# Patient Record
Sex: Male | Born: 1950 | Race: White | Hispanic: No | Marital: Married | Smoking: Never smoker
Health system: Southern US, Community
[De-identification: ages and names within clinical notes are randomized; demographics above are authoritative.]

## PROBLEM LIST (undated history)

## (undated) DIAGNOSIS — M109 Gout, unspecified: Secondary | ICD-10-CM

## (undated) DIAGNOSIS — N390 Urinary tract infection, site not specified: Secondary | ICD-10-CM

## (undated) DIAGNOSIS — I10 Essential (primary) hypertension: Secondary | ICD-10-CM

## (undated) HISTORY — PX: CLEFT PALATE REPAIR: SUR1165

---

## 2006-04-21 ENCOUNTER — Ambulatory Visit (HOSPITAL_COMMUNITY): Admission: RE | Admit: 2006-04-21 | Discharge: 2006-04-21 | Payer: Self-pay | Admitting: *Deleted

## 2013-11-06 ENCOUNTER — Other Ambulatory Visit (HOSPITAL_COMMUNITY): Payer: Self-pay | Admitting: Orthopedic Surgery

## 2013-11-27 ENCOUNTER — Encounter (HOSPITAL_COMMUNITY): Payer: Self-pay

## 2013-11-27 ENCOUNTER — Encounter (HOSPITAL_COMMUNITY)
Admission: RE | Admit: 2013-11-27 | Discharge: 2013-11-27 | Disposition: A | Discharge status: Home / Self Care | Payer: BC Managed Care – PPO | Source: Ambulatory Visit | Attending: Orthopedic Surgery | Admitting: Orthopedic Surgery

## 2013-11-27 DIAGNOSIS — M1710 Unilateral primary osteoarthritis, unspecified knee: Secondary | ICD-10-CM | POA: Insufficient documentation

## 2013-11-27 DIAGNOSIS — Z01810 Encounter for preprocedural cardiovascular examination: Secondary | ICD-10-CM | POA: Insufficient documentation

## 2013-11-27 DIAGNOSIS — Z01812 Encounter for preprocedural laboratory examination: Secondary | ICD-10-CM | POA: Diagnosis present

## 2013-11-27 HISTORY — DX: Essential (primary) hypertension: I10

## 2013-11-27 HISTORY — DX: Gout, unspecified: M10.9

## 2013-11-27 LAB — CBC
HCT: 40.8 % (ref 39.0–52.0)
Hemoglobin: 14 g/dL (ref 13.0–17.0)
MCH: 29.4 pg (ref 26.0–34.0)
MCHC: 34.3 g/dL (ref 30.0–36.0)
MCV: 85.5 fL (ref 78.0–100.0)
PLATELETS: 210 10*3/uL (ref 150–400)
RBC: 4.77 MIL/uL (ref 4.22–5.81)
RDW: 13.4 % (ref 11.5–15.5)
WBC: 7.4 10*3/uL (ref 4.0–10.5)

## 2013-11-27 LAB — BASIC METABOLIC PANEL
Anion gap: 15 (ref 5–15)
BUN: 13 mg/dL (ref 6–23)
CO2: 23 mEq/L (ref 19–32)
Calcium: 9.1 mg/dL (ref 8.4–10.5)
Chloride: 101 mEq/L (ref 96–112)
Creatinine, Ser: 0.89 mg/dL (ref 0.50–1.35)
GFR calc non Af Amer: 90 mL/min — ABNORMAL LOW (ref >90)
GLUCOSE: 93 mg/dL (ref 70–99)
POTASSIUM: 4.3 meq/L (ref 3.7–5.3)
SODIUM: 139 meq/L (ref 137–147)

## 2013-11-27 LAB — APTT: aPTT: 28 seconds (ref 24–37)

## 2013-11-27 LAB — PROTIME-INR
INR: 0.98 (ref 0.00–1.49)
PROTHROMBIN TIME: 13 s (ref 11.6–15.2)

## 2013-11-27 LAB — SURGICAL PCR SCREEN
MRSA, PCR: NEGATIVE
Staphylococcus aureus: NEGATIVE

## 2013-11-27 LAB — TYPE AND SCREEN
ABO/RH(D): AB POS
Antibody Screen: NEGATIVE

## 2013-11-27 LAB — ABO/RH: ABO/RH(D): AB POS

## 2013-11-27 NOTE — Progress Notes (Signed)
Dr Renne Crigler called for old ekgs, stress test

## 2013-11-27 NOTE — Pre-Procedure Instructions (Signed)
Justin Choi  11/27/2013   Your procedure is scheduled on:  12/04/13  Report to [facility] Admitting at 1145 AM.  Call this number if you have problems the morning of surgery: [phone]   Remember:   Do not eat food or drink liquids after midnight.   Take these medicines the morning of surgery with A SIP OF WATER:    Do not wear jewelry, make-up or nail polish.  Do not wear lotions, powders, or perfumes. You may wear deodorant.  Do not shave 48 hours prior to surgery. Men may shave face and neck.  Do not bring valuables to the hospital.  [facility] is not responsible                  for any belongings or valuables.               Contacts, dentures or bridgework may not be worn into surgery.  Leave suitcase in the car. After surgery it may be brought to your room.  For patients admitted to the hospital, discharge time is determined by your                treatment team.               Patients discharged the day of surgery will not be allowed to drive  home.  Name and phone number of your driver: family  Special Instructions: Shower using CHG 2 nights before surgery and the night before surgery.  If you shower the day of surgery use CHG.  Use special wash - you have one bottle of CHG for all showers.  You should use approximately 1/3 of the bottle for each shower.   Please read over the following fact sheets that you were given: Pain Booklet, Coughing and Deep Breathing, Blood Transfusion Information, MRSA Information and Surgical Site Infection Prevention

## 2013-12-03 MED ORDER — CEFAZOLIN SODIUM-DEXTROSE 2-3 GM-% IV SOLR
2.0000 g | INTRAVENOUS | Status: AC
  Administered 2013-12-04: 2 g via INTRAVENOUS
  Filled 2013-12-03: qty 50

## 2013-12-03 NOTE — H&P (Addendum)
TOTAL KNEE ADMISSION H&P  Patient is being admitted for left total knee arthroplasty.  Subjective:  Chief Complaint:left knee pain.  HPI: Justin Choi, 63 y.o. male, has a history of pain and functional disability in the left knee due to arthritis and has failed non-surgical conservative treatments for greater than 12 weeks to includeNSAID's and/or analgesics, corticosteriod injections, use of assistive devices, weight reduction as appropriate and activity modification.  Onset of symptoms was gradual, starting 10 years ago with gradually worsening course since that time. The patient noted no past surgery on the left knee(s).  Patient currently rates pain in the left knee(s) at 8 out of 10 with activity. Patient has night pain, worsening of pain with activity and weight bearing, pain that interferes with activities of daily living, pain with passive range of motion, crepitus and joint swelling.  Patient has evidence of subchondral cysts, subchondral sclerosis, periarticular osteophytes, joint subluxation and joint space narrowing by imaging studies. This patient has had significant worsening pain interfering with work. There is no active infection.  There are no active problems to display for this patient.  Past Medical History  Diagnosis Date  . Hypertension   . Gout     Past Surgical History  Procedure Laterality Date  . 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 Smoking status: Never Smoker   . Smokeless tobacco: Not on file  . Alcohol Use: Yes     Comment: occ    No family history on file.   Review of Systems  Constitutional: Negative.   HENT: Negative.   Eyes: Negative.   Respiratory: Negative.   Cardiovascular: Negative.   Gastrointestinal: Negative.   Genitourinary: Negative.   Musculoskeletal: Positive for joint pain.  Skin: Negative.   Neurological: Negative.    Endo/Heme/Allergies: Negative.   Psychiatric/Behavioral: Negative.     Objective:  Physical Exam  Constitutional: He appears well-developed.  HENT:  Head: Normocephalic.  Eyes: Pupils are equal, round, and reactive to light.  Neck: Normal range of motion.  Cardiovascular: Normal rate.   Respiratory: Effort normal.  Neurological: He is alert.  Skin: Skin is warm.  Psychiatric: He has a normal mood and affect.   left knee demonstrates intact skin range of motion is about 5-90 collaterals are stable extensor mechanism is intact pulses palpable ankle dorsi component flexion is intact  Vital signs in last 24 hours:    Labs:   There is no height or weight on file to calculate BMI.   Imaging Review Plain radiographs demonstrate severe degenerative joint disease of the left knee(s). The overall alignment issignificant varus. The bone quality appears to be good for age and reported activity level.  Assessment/Plan:  End stage arthritis, left knee   The patient history, physical examination, clinical judgment of the provider and imaging studies are consistent with end stage degenerative joint disease of the left knee(s) and total knee arthroplasty is deemed medically necessary. The treatment options including medical management, injection therapy arthroscopy and arthroplasty were discussed at length. The risks and benefits of total knee arthroplasty were presented and reviewed. The risks due to aseptic loosening, infection, stiffness, patella tracking problems, thromboembolic complications and other imponderables were discussed. The patient acknowledged the explanation, agreed to proceed with the plan and consent was signed. Patient is being admitted for inpatient treatment for surgery, pain control, PT, OT, prophylactic antibiotics,  VTE prophylaxis, progressive ambulation and ADL's and discharge planning. The patient is planning to be discharged home with home health services

## 2013-12-04 ENCOUNTER — Inpatient Hospital Stay (HOSPITAL_COMMUNITY): Payer: BC Managed Care – PPO | Admitting: Anesthesiology

## 2013-12-04 ENCOUNTER — Inpatient Hospital Stay (HOSPITAL_COMMUNITY)
Admission: RE | Admit: 2013-12-04 | Discharge: 2013-12-08 | DRG: 470 | Disposition: A | Discharge status: Skilled Nursing Facility | Payer: BC Managed Care – PPO | Source: Ambulatory Visit | Attending: Orthopedic Surgery | Admitting: Orthopedic Surgery

## 2013-12-04 ENCOUNTER — Encounter (HOSPITAL_COMMUNITY): Payer: Self-pay | Admitting: Anesthesiology

## 2013-12-04 ENCOUNTER — Encounter (HOSPITAL_COMMUNITY): Payer: BC Managed Care – PPO | Admitting: Anesthesiology

## 2013-12-04 ENCOUNTER — Encounter (HOSPITAL_COMMUNITY)
Admission: RE | Disposition: A | Discharge status: Skilled Nursing Facility | Payer: Self-pay | Source: Ambulatory Visit | Attending: Orthopedic Surgery

## 2013-12-04 DIAGNOSIS — M109 Gout, unspecified: Secondary | ICD-10-CM | POA: Diagnosis present

## 2013-12-04 DIAGNOSIS — I10 Essential (primary) hypertension: Secondary | ICD-10-CM | POA: Diagnosis present

## 2013-12-04 DIAGNOSIS — Z87790 Personal history of (corrected) congenital malformations of face and neck: Secondary | ICD-10-CM | POA: Diagnosis not present

## 2013-12-04 DIAGNOSIS — Z87720 Personal history of (corrected) congenital malformations of eye: Secondary | ICD-10-CM

## 2013-12-04 DIAGNOSIS — M1710 Unilateral primary osteoarthritis, unspecified knee: Secondary | ICD-10-CM

## 2013-12-04 DIAGNOSIS — G4730 Sleep apnea, unspecified: Secondary | ICD-10-CM | POA: Diagnosis present

## 2013-12-04 DIAGNOSIS — Z87721 Personal history of (corrected) congenital malformations of ear: Secondary | ICD-10-CM

## 2013-12-04 HISTORY — PX: TOTAL KNEE ARTHROPLASTY: SHX125

## 2013-12-04 LAB — URINALYSIS, ROUTINE W REFLEX MICROSCOPIC
BILIRUBIN URINE: NEGATIVE
GLUCOSE, UA: NEGATIVE mg/dL
Ketones, ur: NEGATIVE mg/dL
Leukocytes, UA: NEGATIVE
Nitrite: NEGATIVE
PH: 6 (ref 5.0–8.0)
Protein, ur: NEGATIVE mg/dL
Specific Gravity, Urine: 1.018 (ref 1.005–1.030)
Urobilinogen, UA: 1 mg/dL (ref 0.0–1.0)

## 2013-12-04 LAB — URINE MICROSCOPIC-ADD ON

## 2013-12-04 SURGERY — ARTHROPLASTY, KNEE, TOTAL
Anesthesia: General | Site: Knee | Laterality: Left

## 2013-12-04 MED ORDER — MORPHINE SULFATE 4 MG/ML IJ SOLN
INTRAMUSCULAR | Status: AC
  Filled 2013-12-04: qty 2

## 2013-12-04 MED ORDER — ROCURONIUM BROMIDE 100 MG/10ML IV SOLN
INTRAVENOUS | Status: DC | PRN
  Administered 2013-12-04: 50 mg via INTRAVENOUS

## 2013-12-04 MED ORDER — DEXAMETHASONE SODIUM PHOSPHATE 4 MG/ML IJ SOLN
INTRAMUSCULAR | Status: DC | PRN
  Administered 2013-12-04: 8 mg via INTRAVENOUS

## 2013-12-04 MED ORDER — ROCURONIUM BROMIDE 50 MG/5ML IV SOLN
INTRAVENOUS | Status: AC
  Filled 2013-12-04: qty 1

## 2013-12-04 MED ORDER — LIDOCAINE HCL (CARDIAC) 20 MG/ML IV SOLN
INTRAVENOUS | Status: AC
  Filled 2013-12-04: qty 5

## 2013-12-04 MED ORDER — NEOSTIGMINE METHYLSULFATE 10 MG/10ML IV SOLN
INTRAVENOUS | Status: DC | PRN
  Administered 2013-12-04: 3 mg via INTRAVENOUS

## 2013-12-04 MED ORDER — BISOPROLOL FUMARATE 5 MG PO TABS
5.0000 mg | ORAL_TABLET | Freq: Every day | ORAL | Status: DC
  Administered 2013-12-06 – 2013-12-08 (×3): 5 mg via ORAL
  Filled 2013-12-04 (×4): qty 1

## 2013-12-04 MED ORDER — METHOCARBAMOL 500 MG PO TABS
500.0000 mg | ORAL_TABLET | Freq: Four times a day (QID) | ORAL | Status: DC | PRN
  Administered 2013-12-05 – 2013-12-08 (×7): 500 mg via ORAL
  Filled 2013-12-04 (×8): qty 1

## 2013-12-04 MED ORDER — ONDANSETRON HCL 4 MG PO TABS
4.0000 mg | ORAL_TABLET | Freq: Four times a day (QID) | ORAL | Status: DC | PRN
  Administered 2013-12-05: 4 mg via ORAL
  Filled 2013-12-04: qty 1

## 2013-12-04 MED ORDER — OXYCODONE HCL 5 MG PO TABS
5.0000 mg | ORAL_TABLET | ORAL | Status: DC | PRN
  Administered 2013-12-05 (×2): 10 mg via ORAL
  Administered 2013-12-05 (×3): 5 mg via ORAL
  Administered 2013-12-06 – 2013-12-08 (×11): 10 mg via ORAL
  Filled 2013-12-04: qty 1
  Filled 2013-12-04 (×7): qty 2
  Filled 2013-12-04: qty 1
  Filled 2013-12-04 (×7): qty 2

## 2013-12-04 MED ORDER — SODIUM CHLORIDE 0.9 % IJ SOLN
INTRAMUSCULAR | Status: DC | PRN
  Administered 2013-12-04: 60 mL

## 2013-12-04 MED ORDER — MORPHINE SULFATE 4 MG/ML IJ SOLN
INTRAMUSCULAR | Status: DC | PRN
  Administered 2013-12-04: 8 mg

## 2013-12-04 MED ORDER — CLONIDINE HCL (ANALGESIA) 100 MCG/ML EP SOLN
150.0000 ug | Freq: Once | EPIDURAL | Status: DC
  Filled 2013-12-04: qty 1.5

## 2013-12-04 MED ORDER — ACETAMINOPHEN 325 MG PO TABS: 650.0000 mg | ORAL_TABLET | Freq: Four times a day (QID) | ORAL | Status: DC | PRN

## 2013-12-04 MED ORDER — LIDOCAINE HCL 4 % MT SOLN
OROMUCOSAL | Status: DC | PRN
  Administered 2013-12-04: 2 mL via TOPICAL

## 2013-12-04 MED ORDER — HYDROMORPHONE HCL 1 MG/ML IJ SOLN
INTRAMUSCULAR | Status: AC
  Filled 2013-12-04: qty 1

## 2013-12-04 MED ORDER — CEFAZOLIN SODIUM-DEXTROSE 2-3 GM-% IV SOLR
2.0000 g | Freq: Three times a day (TID) | INTRAVENOUS | Status: AC
  Administered 2013-12-05 (×2): 2 g via INTRAVENOUS
  Filled 2013-12-04 (×2): qty 50

## 2013-12-04 MED ORDER — POTASSIUM CHLORIDE IN NACL 20-0.9 MEQ/L-% IV SOLN
INTRAVENOUS | Status: AC
  Administered 2013-12-04 – 2013-12-05 (×2): via INTRAVENOUS
  Filled 2013-12-04 (×2): qty 1000

## 2013-12-04 MED ORDER — CHLORHEXIDINE GLUCONATE 4 % EX LIQD
60.0000 mL | Freq: Once | CUTANEOUS | Status: DC
  Filled 2013-12-04: qty 60

## 2013-12-04 MED ORDER — ADULT MULTIVITAMIN W/MINERALS CH
1.0000 | ORAL_TABLET | Freq: Every day | ORAL | Status: DC
  Administered 2013-12-04 – 2013-12-08 (×5): 1 via ORAL
  Filled 2013-12-04 (×5): qty 1

## 2013-12-04 MED ORDER — LACTATED RINGERS IV SOLN: INTRAVENOUS | Status: DC

## 2013-12-04 MED ORDER — LIDOCAINE HCL (CARDIAC) 20 MG/ML IV SOLN
INTRAVENOUS | Status: AC
  Filled 2013-12-04: qty 15

## 2013-12-04 MED ORDER — PROPOFOL 10 MG/ML IV BOLUS
INTRAVENOUS | Status: DC | PRN
  Administered 2013-12-04: 200 mg via INTRAVENOUS

## 2013-12-04 MED ORDER — ONDANSETRON HCL 4 MG/2ML IJ SOLN: 4.0000 mg | Freq: Four times a day (QID) | INTRAMUSCULAR | Status: DC | PRN

## 2013-12-04 MED ORDER — 0.9 % SODIUM CHLORIDE (POUR BTL) OPTIME
TOPICAL | Status: DC | PRN
  Administered 2013-12-04: 1000 mL

## 2013-12-04 MED ORDER — MIDAZOLAM HCL 2 MG/2ML IJ SOLN
INTRAMUSCULAR | Status: AC
  Filled 2013-12-04: qty 2

## 2013-12-04 MED ORDER — HYDROMORPHONE HCL 1 MG/ML IJ SOLN
0.2500 mg | INTRAMUSCULAR | Status: DC | PRN
  Administered 2013-12-04: 0.5 mg via INTRAVENOUS

## 2013-12-04 MED ORDER — ONDANSETRON HCL 4 MG/2ML IJ SOLN
INTRAMUSCULAR | Status: DC | PRN
  Administered 2013-12-04: 4 mg via INTRAVENOUS

## 2013-12-04 MED ORDER — DEXAMETHASONE SODIUM PHOSPHATE 4 MG/ML IJ SOLN
INTRAMUSCULAR | Status: AC
  Filled 2013-12-04: qty 4

## 2013-12-04 MED ORDER — MENTHOL 3 MG MT LOZG: 1.0000 | LOZENGE | OROMUCOSAL | Status: DC | PRN

## 2013-12-04 MED ORDER — LACTATED RINGERS IV SOLN
INTRAVENOUS | Status: DC | PRN
  Administered 2013-12-04 (×2): via INTRAVENOUS

## 2013-12-04 MED ORDER — PROPOFOL 10 MG/ML IV BOLUS
INTRAVENOUS | Status: AC
  Filled 2013-12-04: qty 20

## 2013-12-04 MED ORDER — BUPIVACAINE LIPOSOME 1.3 % IJ SUSP
INTRAMUSCULAR | Status: DC | PRN
  Administered 2013-12-04: 20 mL

## 2013-12-04 MED ORDER — DOCUSATE SODIUM 100 MG PO CAPS
100.0000 mg | ORAL_CAPSULE | Freq: Two times a day (BID) | ORAL | Status: DC
  Administered 2013-12-04 – 2013-12-08 (×8): 100 mg via ORAL
  Filled 2013-12-04 (×8): qty 1

## 2013-12-04 MED ORDER — METHOCARBAMOL 1000 MG/10ML IJ SOLN
500.0000 mg | Freq: Four times a day (QID) | INTRAVENOUS | Status: DC | PRN
  Filled 2013-12-04: qty 5

## 2013-12-04 MED ORDER — FENTANYL CITRATE 0.05 MG/ML IJ SOLN
INTRAMUSCULAR | Status: DC | PRN
  Administered 2013-12-04 (×5): 50 ug via INTRAVENOUS

## 2013-12-04 MED ORDER — ACETAMINOPHEN 650 MG RE SUPP
650.0000 mg | Freq: Four times a day (QID) | RECTAL | Status: DC | PRN
Start: 2013-12-04 — End: 2013-12-08

## 2013-12-04 MED ORDER — ONDANSETRON HCL 4 MG/2ML IJ SOLN: 4.0000 mg | Freq: Once | INTRAMUSCULAR | Status: DC | PRN

## 2013-12-04 MED ORDER — LIDOCAINE HCL (CARDIAC) 20 MG/ML IV SOLN
INTRAVENOUS | Status: DC | PRN
  Administered 2013-12-04: 30 mg via INTRAVENOUS

## 2013-12-04 MED ORDER — METOCLOPRAMIDE HCL 5 MG/ML IJ SOLN
5.0000 mg | Freq: Three times a day (TID) | INTRAMUSCULAR | Status: DC | PRN
Start: 2013-12-04 — End: 2013-12-08

## 2013-12-04 MED ORDER — PHENYLEPHRINE HCL 10 MG/ML IJ SOLN
INTRAMUSCULAR | Status: DC | PRN
  Administered 2013-12-04: 80 ug via INTRAVENOUS

## 2013-12-04 MED ORDER — ONDANSETRON HCL 4 MG/2ML IJ SOLN
INTRAMUSCULAR | Status: AC
  Filled 2013-12-04: qty 2

## 2013-12-04 MED ORDER — NEOSTIGMINE METHYLSULFATE 10 MG/10ML IV SOLN
INTRAVENOUS | Status: AC
  Filled 2013-12-04: qty 1

## 2013-12-04 MED ORDER — WARFARIN - PHARMACIST DOSING INPATIENT: Freq: Every day | Status: DC

## 2013-12-04 MED ORDER — BUPIVACAINE LIPOSOME 1.3 % IJ SUSP
20.0000 mL | Freq: Once | INTRAMUSCULAR | Status: DC
  Filled 2013-12-04: qty 20

## 2013-12-04 MED ORDER — MIDAZOLAM HCL 5 MG/5ML IJ SOLN
INTRAMUSCULAR | Status: DC | PRN
  Administered 2013-12-04 (×2): 1 mg via INTRAVENOUS

## 2013-12-04 MED ORDER — FENTANYL CITRATE 0.05 MG/ML IJ SOLN
INTRAMUSCULAR | Status: AC
  Filled 2013-12-04: qty 5

## 2013-12-04 MED ORDER — GLYCOPYRROLATE 0.2 MG/ML IJ SOLN
INTRAMUSCULAR | Status: DC | PRN
  Administered 2013-12-04 (×2): 0.4 mg via INTRAVENOUS

## 2013-12-04 MED ORDER — EPHEDRINE SULFATE 50 MG/ML IJ SOLN
INTRAMUSCULAR | Status: DC | PRN
  Administered 2013-12-04: 5 mg via INTRAVENOUS
  Administered 2013-12-04: 10 mg via INTRAVENOUS
  Administered 2013-12-04: 5 mg via INTRAVENOUS
  Administered 2013-12-04: 10 mg via INTRAVENOUS
  Administered 2013-12-04: 5 mg via INTRAVENOUS
  Administered 2013-12-04: 10 mg via INTRAVENOUS
  Administered 2013-12-04: 5 mg via INTRAVENOUS

## 2013-12-04 MED ORDER — WARFARIN SODIUM 7.5 MG PO TABS
7.5000 mg | ORAL_TABLET | Freq: Once | ORAL | Status: AC
  Administered 2013-12-05: 7.5 mg via ORAL
  Filled 2013-12-04: qty 1

## 2013-12-04 MED ORDER — CLONIDINE HCL (ANALGESIA) 100 MCG/ML EP SOLN
EPIDURAL | Status: DC | PRN
  Administered 2013-12-04: 1 mL

## 2013-12-04 MED ORDER — HYDROMORPHONE HCL 1 MG/ML IJ SOLN: 1.0000 mg | INTRAMUSCULAR | Status: DC | PRN

## 2013-12-04 MED ORDER — PHENOL 1.4 % MT LIQD
1.0000 | OROMUCOSAL | Status: DC | PRN
Start: 2013-12-04 — End: 2013-12-08

## 2013-12-04 MED ORDER — TRANEXAMIC ACID 100 MG/ML IV SOLN
1000.0000 mg | INTRAVENOUS | Status: AC
  Administered 2013-12-04: 1000 mg via INTRAVENOUS
  Filled 2013-12-04: qty 10

## 2013-12-04 MED ORDER — DEXAMETHASONE SODIUM PHOSPHATE 4 MG/ML IJ SOLN
INTRAMUSCULAR | Status: AC
Start: 2013-12-04 — End: 2013-12-04
  Filled 2013-12-04: qty 2

## 2013-12-04 MED ORDER — METOCLOPRAMIDE HCL 5 MG PO TABS: 5.0000 mg | ORAL_TABLET | Freq: Three times a day (TID) | ORAL | Status: DC | PRN

## 2013-12-04 MED ORDER — BUPIVACAINE HCL (PF) 0.25 % IJ SOLN
INTRAMUSCULAR | Status: DC | PRN
  Administered 2013-12-04: 20 mL

## 2013-12-04 SURGICAL SUPPLY — 61 items
BANDAGE ESMARK 6X9 LF (GAUZE/BANDAGES/DRESSINGS) ×1 IMPLANT
BLADE SAG 18X100X1.27 (BLADE) ×2 IMPLANT
BLADE SAW SGTL 13.0X1.19X90.0M (BLADE) ×2 IMPLANT
BNDG COHESIVE 6X5 TAN STRL LF (GAUZE/BANDAGES/DRESSINGS) ×2 IMPLANT
BNDG ELASTIC 6X10 VLCR STRL LF (GAUZE/BANDAGES/DRESSINGS) ×2 IMPLANT
BNDG ESMARK 6X9 LF (GAUZE/BANDAGES/DRESSINGS) ×2
BOWL SMART MIX CTS (DISPOSABLE) ×2 IMPLANT
CEMENT BONE SIMPLEX SPEEDSET (Cement) ×6 IMPLANT
COVER SURGICAL LIGHT HANDLE (MISCELLANEOUS) ×2 IMPLANT
DRAPE INCISE IOBAN 66X45 STRL (DRAPES) ×2 IMPLANT
DRAPE ORTHO SPLIT 77X108 STRL (DRAPES) ×3
DRAPE SURG ORHT 6 SPLT 77X108 (DRAPES) ×3 IMPLANT
DRAPE U-SHAPE 47X51 STRL (DRAPES) ×2 IMPLANT
DURAPREP 26ML APPLICATOR (WOUND CARE) ×2 IMPLANT
ELECT REM PT RETURN 9FT ADLT (ELECTROSURGICAL) ×2
ELECTRODE REM PT RTRN 9FT ADLT (ELECTROSURGICAL) ×1 IMPLANT
FACESHIELD WRAPAROUND (MASK) IMPLANT
GAUZE SPONGE 4X4 12PLY STRL (GAUZE/BANDAGES/DRESSINGS) ×2 IMPLANT
GAUZE XEROFORM 5X9 LF (GAUZE/BANDAGES/DRESSINGS) ×2 IMPLANT
GLOVE BIOGEL PI IND STRL 7.5 (GLOVE) ×2 IMPLANT
GLOVE BIOGEL PI IND STRL 8 (GLOVE) ×1 IMPLANT
GLOVE BIOGEL PI INDICATOR 7.5 (GLOVE) ×2
GLOVE BIOGEL PI INDICATOR 8 (GLOVE) ×1
GLOVE ECLIPSE 7.0 STRL STRAW (GLOVE) ×4 IMPLANT
GLOVE SURG ORTHO 8.0 STRL STRW (GLOVE) ×4 IMPLANT
GOWN STRL REUS W/ TWL LRG LVL3 (GOWN DISPOSABLE) ×1 IMPLANT
GOWN STRL REUS W/ TWL XL LVL3 (GOWN DISPOSABLE) ×2 IMPLANT
GOWN STRL REUS W/TWL LRG LVL3 (GOWN DISPOSABLE) ×1
GOWN STRL REUS W/TWL XL LVL3 (GOWN DISPOSABLE) ×2
HANDPIECE INTERPULSE COAX TIP (DISPOSABLE) ×1
HOOD PEEL AWAY FACE SHEILD DIS (HOOD) ×6 IMPLANT
IMMOBILIZER KNEE 24 THIGH 36 (MISCELLANEOUS) ×1 IMPLANT
IMMOBILIZER KNEE 24 UNIV (MISCELLANEOUS) ×2
KIT BASIN OR (CUSTOM PROCEDURE TRAY) ×2 IMPLANT
KIT ROOM TURNOVER OR (KITS) ×2 IMPLANT
KNEE/VIT E POLY LINER LEVEL 1B ×2 IMPLANT
MANIFOLD NEPTUNE II (INSTRUMENTS) ×2 IMPLANT
NEEDLE 21X1 OR PACK (NEEDLE) ×6 IMPLANT
NEEDLE SPNL 18GX3.5 QUINCKE PK (NEEDLE) ×2 IMPLANT
NS IRRIG 1000ML POUR BTL (IV SOLUTION) ×2 IMPLANT
PACK TOTAL JOINT (CUSTOM PROCEDURE TRAY) ×2 IMPLANT
PAD ABD 8X10 STRL (GAUZE/BANDAGES/DRESSINGS) ×2 IMPLANT
PAD ARMBOARD 7.5X6 YLW CONV (MISCELLANEOUS) ×4 IMPLANT
PAD CAST 4YDX4 CTTN HI CHSV (CAST SUPPLIES) ×1 IMPLANT
PADDING CAST COTTON 4X4 STRL (CAST SUPPLIES) ×1
PADDING CAST COTTON 6X4 STRL (CAST SUPPLIES) ×2 IMPLANT
RUBBERBAND STERILE (MISCELLANEOUS) IMPLANT
SET HNDPC FAN SPRY TIP SCT (DISPOSABLE) ×1 IMPLANT
STAPLER VISISTAT 35W (STAPLE) ×2 IMPLANT
SUCTION FRAZIER TIP 10 FR DISP (SUCTIONS) ×2 IMPLANT
SUT VIC AB 0 CTB1 27 (SUTURE) ×4 IMPLANT
SUT VIC AB 1 CT1 27 (SUTURE) ×5
SUT VIC AB 1 CT1 27XBRD ANBCTR (SUTURE) ×5 IMPLANT
SUT VIC AB 2-0 CT1 27 (SUTURE) ×2
SUT VIC AB 2-0 CT1 TAPERPNT 27 (SUTURE) ×2 IMPLANT
SYR 20CC LL (SYRINGE) ×6 IMPLANT
SYR CONTROL 10ML LL (SYRINGE) ×2 IMPLANT
TOWEL OR 17X24 6PK STRL BLUE (TOWEL DISPOSABLE) ×4 IMPLANT
TOWEL OR 17X26 10 PK STRL BLUE (TOWEL DISPOSABLE) ×4 IMPLANT
TRAY FOLEY CATH 16FRSI W/METER (SET/KITS/TRAYS/PACK) ×2 IMPLANT
WATER STERILE IRR 1000ML POUR (IV SOLUTION) ×4 IMPLANT

## 2013-12-04 NOTE — Progress Notes (Signed)
Patient arrived to 4N30 AAOx4 and denies any pain. CPM machine in place as well as SCD's. Pt oriented to the room, questions answered and call bell by his side. Will continue to monitor. Coretha Creswell, [place]

## 2013-12-04 NOTE — OR Nursing (Signed)
Patient with a yellow colored ring to left third finger.  Patient unable to remove it.  Dr. Katrinka Blazing also tried to remove it.  Dr. August Saucer aware prior to surgery start.

## 2013-12-04 NOTE — Brief Op Note (Signed)
12/04/2013  7:08 PM  PATIENT:  Justin Choi  63 y.o. male  PRE-OPERATIVE DIAGNOSIS:  LEFT KNEE OSTEOARTHRITIS  POST-OPERATIVE DIAGNOSIS:  LEFT KNEE OSTEOARTHRITIS  PROCEDURE:  Procedure(s): LEFT TOTAL KNEE ARTHROPLASTY  SURGEON:  Surgeon(s): Cammy Copa, MD  ASSISTANT: s vernon pa  ANESTHESIA:   general  EBL: 100 ml       BLOOD ADMINISTERED: none  DRAINS: none   LOCAL MEDICATIONS USED:  exparel marcaine mso4  SPECIMEN:  No Specimen  COUNTS:  YES  TOURNIQUET:   Total Tourniquet Time Documented: Thigh (Left) - -31 minutes Total: Thigh (Left) - -31 minutes   DICTATION: .Other Dictation: Dictation Number [id number]  PLAN OF CARE: Admit to inpatient   PATIENT DISPOSITION:  PACU - hemodynamically stable

## 2013-12-04 NOTE — Interval H&P Note (Signed)
History and Physical Interval Note:  12/04/2013 5:17 AM  Justin Choi  has presented today for surgery, with the diagnosis of LEFT KNEE OSTEOARTHRITIS  The various methods of treatment have been discussed with the patient and family. After consideration of risks, benefits and other options for treatment, the patient has consented to  Procedure(s): LEFT TOTAL KNEE ARTHROPLASTY (Left) as a surgical intervention .  The patient's history has been reviewed, patient examined, no change in status, stable for surgery.  I have reviewed the patient's chart and labs.  Questions were answered to the patient's satisfaction.     Hubert Raatz SCOTT

## 2013-12-04 NOTE — Progress Notes (Signed)
ANTICOAGULATION CONSULT NOTE - Initial Consult  Pharmacy Consult for Warfarin Indication: VTE prophylaxis  No Known Allergies  Patient Measurements: Height:  (177.8 cm) Weight: 258 lb (117.028 kg) IBW/kg (Calculated) : 73   Vital Signs: Temp: 97.9 F (36.6 C) (09/22 2143) Temp src: Oral (09/22 2143) BP: 141/88 mmHg (09/22 2143) Pulse Rate: 80 (09/22 2143)  Labs: No results found for this basename: HGB, HCT, PLT, APTT, LABPROT, INR, HEPARINUNFRC, CREATININE, CKTOTAL, CKMB, TROPONINI,  in the last 72 hours  Estimated Creatinine Clearance: 110.3 ml/min (by C-G formula based on Cr of 0.89).   Medical History: Past Medical History  Diagnosis Date  . Hypertension   . Gout      Assessment: 62yom admitted for L TKA.  Plan for warfarin as VTE px post op.  Baseline INR 0.98, CBC stable.    Goal of Therapy:  INR 2-3 Monitor platelets by anticoagulation protocol: Yes   Plan:  Warfarin 7.5mg  x1 Daily Protime  Leota Sauers Pharm.D. CPP, BCPS Clinical Pharmacist [phone] 12/04/2013 10:50 PM

## 2013-12-04 NOTE — Anesthesia Preprocedure Evaluation (Addendum)
Anesthesia Evaluation  Patient identified by MRN, date of birth, ID band Patient awake    Reviewed: Allergy & Precautions, H&P , NPO status , Patient's Chart, lab work & pertinent test results  History of Anesthesia Complications Negative for: history of anesthetic complications  Airway       Dental   Pulmonary sleep apnea ,          Cardiovascular hypertension, Pt. on home beta blockers     Neuro/Psych negative neurological ROS  negative psych ROS   GI/Hepatic negative GI ROS, Neg liver ROS,   Endo/Other  Morbid obesity  Renal/GU negative Renal ROS     Musculoskeletal negative musculoskeletal ROS (+)   Abdominal   Peds  Hematology negative hematology ROS (+)   Anesthesia Other Findings   Reproductive/Obstetrics                          Anesthesia Physical Anesthesia Plan  ASA: II  Anesthesia Plan: General   Post-op Pain Management:    Induction: Intravenous  Airway Management Planned: Oral ETT  Additional Equipment:   Intra-op Plan:   Post-operative Plan: Extubation in OR  Informed Consent: I have reviewed the patients History and Physical, chart, labs and discussed the procedure including the risks, benefits and alternatives for the proposed anesthesia with the patient or authorized representative who has indicated his/her understanding and acceptance.     Plan Discussed with: CRNA, Anesthesiologist and Surgeon  Anesthesia Plan Comments:         Anesthesia Quick Evaluation

## 2013-12-04 NOTE — Transfer of Care (Signed)
Immediate Anesthesia Transfer of Care Note  Patient: Justin Choi  Procedure(s) Performed: Procedure(s): LEFT TOTAL KNEE ARTHROPLASTY (Left)  Patient Location: PACU  Anesthesia Type:General  Level of Consciousness: awake, alert  and oriented  Airway & Oxygen Therapy: Patient Spontanous Breathing and Patient connected to face mask oxygen  Post-op Assessment: Report given to PACU RN, Post -op Vital signs reviewed and stable and Patient moving all extremities  Post vital signs: Reviewed and stable  Complications: No apparent anesthesia complications

## 2013-12-04 NOTE — Progress Notes (Signed)
Orthopedic Tech Progress Note Patient Details:  Justin Choi February 03, 1951 [id number]  CPM Left Knee CPM Left Knee: On Left Knee Flexion (Degrees): 40 Left Knee Extension (Degrees): 0 Additional Comments: trapeze bar patient helper  Footsie roll; viewed order from doctor's order list Nikki Dom 12/04/2013, 8:47 PM

## 2013-12-05 ENCOUNTER — Encounter (HOSPITAL_COMMUNITY): Payer: Self-pay | Admitting: Orthopedic Surgery

## 2013-12-05 LAB — BASIC METABOLIC PANEL
Anion gap: 15 (ref 5–15)
BUN: 16 mg/dL (ref 6–23)
CO2: 24 mEq/L (ref 19–32)
Calcium: 8.3 mg/dL — ABNORMAL LOW (ref 8.4–10.5)
Chloride: 99 mEq/L (ref 96–112)
Creatinine, Ser: 0.89 mg/dL (ref 0.50–1.35)
GFR, EST NON AFRICAN AMERICAN: 90 mL/min — AB (ref >90)
Glucose, Bld: 160 mg/dL — ABNORMAL HIGH (ref 70–99)
Potassium: 4.3 mEq/L (ref 3.7–5.3)
SODIUM: 138 meq/L (ref 137–147)

## 2013-12-05 LAB — CBC
HCT: 35.3 % — ABNORMAL LOW (ref 39.0–52.0)
HEMOGLOBIN: 12 g/dL — AB (ref 13.0–17.0)
MCH: 29.6 pg (ref 26.0–34.0)
MCHC: 34 g/dL (ref 30.0–36.0)
MCV: 87.2 fL (ref 78.0–100.0)
Platelets: 191 10*3/uL (ref 150–400)
RBC: 4.05 MIL/uL — ABNORMAL LOW (ref 4.22–5.81)
RDW: 13.4 % (ref 11.5–15.5)
WBC: 11 10*3/uL — ABNORMAL HIGH (ref 4.0–10.5)

## 2013-12-05 LAB — PROTIME-INR
INR: 1.07 (ref 0.00–1.49)
PROTHROMBIN TIME: 13.9 s (ref 11.6–15.2)

## 2013-12-05 MED ORDER — COUMADIN BOOK
Freq: Once | Status: AC
  Administered 2013-12-05: 17:00:00
  Filled 2013-12-05: qty 1

## 2013-12-05 MED ORDER — WARFARIN VIDEO
Freq: Once | Status: AC
  Administered 2013-12-05: 17:00:00

## 2013-12-05 MED ORDER — WARFARIN SODIUM 7.5 MG PO TABS
7.5000 mg | ORAL_TABLET | Freq: Once | ORAL | Status: AC
Start: 2013-12-05 — End: 2013-12-05
  Administered 2013-12-05: 7.5 mg via ORAL
  Filled 2013-12-05: qty 1

## 2013-12-05 NOTE — Anesthesia Postprocedure Evaluation (Signed)
  Anesthesia Post-op Note  Patient: Justin Choi  Procedure(s) Performed: Procedure(s): LEFT TOTAL KNEE ARTHROPLASTY (Left)  Patient Location: PACU  Anesthesia Type:General  Level of Consciousness: awake  Airway and Oxygen Therapy: Patient Spontanous Breathing  Post-op Pain: mild  Post-op Assessment: Post-op Vital signs reviewed, Patient's Cardiovascular Status Stable, Respiratory Function Stable, Patent Airway, No signs of Nausea or vomiting and Pain level controlled  Post-op Vital Signs: Reviewed and stable  Last Vitals:  Filed Vitals:   12/05/13 0500  BP: 112/61  Pulse: 77  Temp: 36.9 C  Resp: 18    Complications: No apparent anesthesia complications

## 2013-12-05 NOTE — Progress Notes (Signed)
Physical Therapy Treatment Patient Details Name: Justin Choi MRN: [id number] DOB: Oct 29, 1950 Today's Date: 12/05/2013    History of Present Illness s/p Lt TKA 12/04/13.    PT Comments    Pt slowly progressing with therapy but continues to require SNF for post acute rehab due to lack of caregiver support. Pt unsafe to D/C home without 24/7 (A) at this time. Will cont to follow per POC.  Follow Up Recommendations  SNF;Supervision/Assistance - 24 hour     Equipment Recommendations  3in1 (PT)    Recommendations for Other Services OT consult     Precautions / Restrictions Precautions Precautions: Fall;Knee Required Braces or Orthoses: Knee Immobilizer - Left Knee Immobilizer - Left: On when out of bed or walking Restrictions Weight Bearing Restrictions: Yes LLE Weight Bearing: Weight bearing as tolerated    Mobility  Bed Mobility Overal bed mobility: Needs Assistance Bed Mobility: Supine to Sit     Supine to sit: Min assist;HOB elevated     General bed mobility comments: incr difficulty this session exiting bed; relying heavily on overhead trapeze; max cues for sequencing and handheld (A) to elevat trunk  Transfers Overall transfer level: Needs assistance Equipment used: Rolling walker (2 wheeled) Transfers: Sit to/from Stand Sit to Stand: Min assist;From elevated surface         General transfer comment: bed elevated for pt; cues for hand placement and sequencing; pt continues to lean heavily to Rt and has difficulty achieving upright posture   Ambulation/Gait Ambulation/Gait assistance: Min assist Ambulation Distance (Feet): 20 Feet Assistive device: Rolling walker (2 wheeled) Gait Pattern/deviations: Step-to pattern;Decreased stance time - left;Decreased step length - right;Antalgic;Shuffle;Trunk flexed Gait velocity: very decreased Gait velocity interpretation: Below normal speed for age/gender General Gait Details: pt continues to have difficulty  clearing Rt foot and tends to ambulate with shuffled Rt step due to incr pain with weightbearing on Lt LE ; multimodal cues for upright posture ; pt fatigues quickly   Stairs            Wheelchair Mobility    Modified Rankin (Stroke Patients Only)       Balance Overall balance assessment: Needs assistance Sitting-balance support: Feet supported;No upper extremity supported Sitting balance-Leahy Scale: Fair     Standing balance support: During functional activity;Bilateral upper extremity supported Standing balance-Leahy Scale: Poor Standing balance comment: unsteady and relies on RW                     Cognition Arousal/Alertness: Awake/alert Behavior During Therapy: Flat affect Overall Cognitive Status: No family/caregiver present to determine baseline cognitive functioning Area of Impairment: Problem solving     Memory: Decreased short-term memory       Problem Solving: Slow processing;Difficulty sequencing;Requires verbal cues General Comments: pt did not remember earlier therapy session; continues to require incr time to process commands     Exercises Total Joint Exercises Ankle Circles/Pumps: AROM;Both;10 reps;Seated Quad Sets: AROM;Strengthening;Left;10 reps;Seated Heel Slides: AAROM;Left;5 reps;Other (comment) (limited by pain and ACE wrap) Hip ABduction/ADduction: AAROM;Left;10 reps;Seated    General Comments General comments (skin integrity, edema, etc.): pt with difficulty recalling no pillow under knee      Pertinent Vitals/Pain Pain Assessment: 0-10 Pain Score: 3  Pain Location: Lt knee Pain Descriptors / Indicators: Shooting Pain Intervention(s): Monitored during session;Premedicated before session;Repositioned    Home Living                      Prior Function  PT Goals (current goals can now be found in the care plan section) Acute Rehab PT Goals Patient Stated Goal: to walk better than i am   PT Goal  Formulation: With patient Time For Goal Achievement: 12/12/13 Potential to Achieve Goals: Good Progress towards PT goals: Progressing toward goals    Frequency  7X/week    PT Plan Current plan remains appropriate    Co-evaluation             End of Session Equipment Utilized During Treatment: Left knee immobilizer;Gait belt Activity Tolerance: Patient limited by fatigue;Patient limited by pain Patient left: in chair;with call bell/phone within reach     Time: 1522-1546 PT Time Calculation (min): 24 min  Charges:  $Gait Training: 8-22 mins $Therapeutic Exercise: 8-22 mins                    G CodesDonell Sievert, [place]  [phone] 12/05/2013, 4:36 PM

## 2013-12-05 NOTE — Progress Notes (Signed)
Subjective: Pt stable - moving in room   Objective: Vital signs in last 24 hours: Temp:  [97.4 F (36.3 C)-99.1 F (37.3 C)] 97.9 F (36.6 C) (09/23 1837) Pulse Rate:  [69-85] 76 (09/23 1837) Resp:  [12-20] 16 (09/23 1837) BP: (108-141)/(61-101) 141/72 mmHg (09/23 1837) SpO2:  [96 %-100 %] 99 % (09/23 1837) Weight:  [117.028 kg (258 lb)] 117.028 kg (258 lb) (09/22 2143)  Intake/Output from previous day: 09/22 0701 - 09/23 0700 In: 1500 [I.V.:1500] Out: 950 [Urine:900; Blood:50] Intake/Output this shift:    Exam:  Neurovascular intact Sensation intact distally Intact pulses distally  Labs:  Recent Labs  12/05/13 0445  HGB 12.0*    Recent Labs  12/05/13 0445  WBC 11.0*  RBC 4.05*  HCT 35.3*  PLT 191    Recent Labs  12/05/13 0445  NA 138  K 4.3  CL 99  CO2 24  BUN 16  CREATININE 0.89  GLUCOSE 160*  CALCIUM 8.3*    Recent Labs  12/05/13 0445  INR 1.07    Assessment/Plan: Plan cpm and pt thurs fri - may need snf Friday or sat   Justin Choi 12/05/2013, 8:39 PM

## 2013-12-05 NOTE — Progress Notes (Signed)
Orthopedic Tech Progress Note Patient Details:  AIJALON KIRTZ 1950/12/15 [id number]  Patient ID: Marquita Palms, male   DOB: Sep 18, 1950, 63 y.o.   MRN: [id number] Placed pt's lle in cpm@0 -35 degrees   Nikki Dom 12/05/2013, 9:08 PM

## 2013-12-05 NOTE — Clinical Social Work Psychosocial (Signed)
Clinical Social Work Department BRIEF PSYCHOSOCIAL ASSESSMENT 12/05/2013  Patient:  Justin Choi, Justin Choi     Account Number:  [id number]     Admit date:  12/04/2013  Clinical Social Worker:  Marciano Sequin  Date/Time:  12/05/2013 11:30 AM  Referred by:  RN  Date Referred:  12/05/2013 Referred for  SNF Placement   Other Referral:   Interview type:  Patient Other interview type:    PSYCHOSOCIAL DATA Living Status:  WIFE Admitted from facility:   Level of care:   Primary support name:  Kassius Battiste, [phone] Primary support relationship to patient:  SPOUSE Degree of support available:   Strong Support    CURRENT CONCERNS  Other Concerns:    SOCIAL WORK ASSESSMENT / PLAN CSW met pt at bedside. CSW introduce self and purpose of visit. Pt presented with a normal affect and calm mood. CSW and pt discussed the clinical team recommendations for rehab. CSW explained the SNF process to the pt. Pt consulted his wife regarding rehab. Pt explained to his wife the importance of rehab. CSW and pt reviewed the SNF list. CSW provided the pt with contact information for further questions. CSW will continue to follow this pt and assist with discharge as needed.   Assessment/plan status:  Information/Referral to [employer] Other assessment/ plan:   Information/referral to community resources:   SNF List    PATIENT'S/FAMILY'S RESPONSE TO PLAN OF CARE: agreed   Greta Doom, MSW, [place]

## 2013-12-05 NOTE — Progress Notes (Signed)
ANTICOAGULATION CONSULT NOTE - Follow up  Pharmacy Consult for Warfarin Indication: VTE prophylaxis  No Known Allergies  Patient Measurements: Height:  (177.8 cm) Weight: 258 lb (117.028 kg) IBW/kg (Calculated) : 73   Vital Signs: Temp: 97.9 F (36.6 C) (09/23 1009) Temp src: Oral (09/23 1009) BP: 108/69 mmHg (09/23 1009) Pulse Rate: 75 (09/23 1009)  Labs:  Recent Labs  12/05/13 0445  HGB 12.0*  HCT 35.3*  PLT 191  LABPROT 13.9  INR 1.07  CREATININE 0.89    Estimated Creatinine Clearance: 110.3 ml/min (by C-G formula based on Cr of 0.89).   Medical History: Past Medical History  Diagnosis Date  . Hypertension   . Gout      Assessment: 63yo male,  weight = 117kg admitted for L TKA.  Plan for warfarin as VTE px post op.  Baseline INR 0.98.  Today INR is 1.07 this AM. CBC decreased Hgb 12 < 14, Hct 35.3 < 40 on POD#1 L TKA. No bleeding noted.      Goal of Therapy:  INR 2-3 Monitor platelets by anticoagulation protocol: Yes   Plan:  Warfarin 7.5mg  x1 Daily INR    Noah Delaine, RPh Clinical Pharmacist Pager: [phone] 12/05/2013 3:45 PM

## 2013-12-05 NOTE — Progress Notes (Signed)
OT Cancellation Note  Patient Details Name: DRYSTAN READER MRN: [id number] DOB: 05-13-1950   Cancelled Treatment:      OT screened. Pt current D/C plan is SNF. No apparent immediate acute care OT needs, therefore will defer OT to SNF. If OT eval is needed please call Acute Rehab Dept. at [phone] or text page OT at [phone].    Earlie Raveling OTR/L [phone] 12/05/2013, 1:10 PM

## 2013-12-05 NOTE — Evaluation (Signed)
Physical Therapy Evaluation Patient Details Name: GRIER VU MRN: [id number] DOB: [birth year] Today's Date: 12/05/2013   History of Present Illness  s/p Lt TKA 12/04/13.  Clinical Impression  Pt is s/p Lt TKA POD#1 resulting in the deficits listed below (see PT Problem List).  Pt will benefit from skilled PT to increase their independence and safety with mobility to allow discharge to the venue listed below. Pt with difficulty following cues and commands at times during session. Pt will not have 24/7 (A) upon D/C home. Recommend SNF at this time for post acute rehab.      Follow Up Recommendations SNF;Supervision/Assistance - 24 hour    Equipment Recommendations  3in1 (PT)    Recommendations for Other Services OT consult     Precautions / Restrictions Precautions Precautions: Fall;Knee Required Braces or Orthoses: Knee Immobilizer - Left Knee Immobilizer - Left: On when out of bed or walking Restrictions Weight Bearing Restrictions: Yes LLE Weight Bearing: Weight bearing as tolerated      Mobility  Bed Mobility Overal bed mobility: Needs Assistance Bed Mobility: Supine to Sit     Supine to sit: Min guard;HOB elevated     General bed mobility comments: max cues for hand placement and sequencing; min guard to initiate movement of Lt LE  Transfers Overall transfer level: Needs assistance Equipment used: Rolling walker (2 wheeled) Transfers: Sit to/from Stand Sit to Stand: Min assist;From elevated surface         General transfer comment: cues for hand placement and safety with sit to stand; pt with heavy lean to Rt due to difficulty weightbearing on Lt  Ambulation/Gait Ambulation/Gait assistance: Min assist Ambulation Distance (Feet): 8 Feet Assistive device: Rolling walker (2 wheeled) Gait Pattern/deviations: Step-to pattern;Decreased stance time - left;Decreased step length - right;Shuffle;Wide base of support;Trunk flexed;Antalgic Gait velocity: very  decreased Gait velocity interpretation: Below normal speed for age/gender General Gait Details: pt with step to gt due to pain with weightbearing on lt Le; cues for sequencing and upright posture; (A) to manage RW   Stairs            Wheelchair Mobility    Modified Rankin (Stroke Patients Only)       Balance Overall balance assessment: Needs assistance Sitting-balance support: Feet supported;No upper extremity supported Sitting balance-Leahy Scale: Fair Sitting balance - Comments: sat EOB ~5 min fo dizziness to subside    Standing balance support: During functional activity;Bilateral upper extremity supported Standing balance-Leahy Scale: Poor Standing balance comment: requires RW for bil UE support                             Pertinent Vitals/Pain Pain Assessment: 0-10 Pain Score: 8  Pain Location: Lt anterior quad with ambulation Pain Descriptors / Indicators: Sharp Pain Intervention(s): Premedicated before session;Monitored during session;Repositioned    Home Living Family/patient expects to be discharged to:: Private residence Living Arrangements: Spouse/significant other Available Help at Discharge: Family;Available PRN/intermittently Type of Home: House Home Access: Stairs to enter Entrance Stairs-Rails: None Entrance Stairs-Number of Steps: 1 Home Layout: Bed/bath upstairs Home Equipment: Walker - 2 wheels;Bedside commode;Cane - single point Additional Comments: wife works during day; pt has tub and walk in shower upstairs ; downstairs is half bath     Prior Function Level of Independence: Independent               Hand Dominance   Dominant Hand: Right    Extremity/Trunk Assessment  Upper Extremity Assessment: Defer to OT evaluation           Lower Extremity Assessment: LLE deficits/detail   LLE Deficits / Details: Lt knee limited by pain; AROM in sitting; flexion limited by pain 40 degrees  Cervical / Trunk Assessment:  Normal  Communication   Communication: No difficulties  Cognition Arousal/Alertness: Awake/alert Behavior During Therapy: Flat affect Overall Cognitive Status: No family/caregiver present to determine baseline cognitive functioning Area of Impairment: Problem solving             Problem Solving: Slow processing;Difficulty sequencing;Requires verbal cues General Comments: pt appeared to take incr time processing commands/cues; uncertain of baseline     General Comments General comments (skin integrity, edema, etc.): reinforced no pillow under knee    Exercises Total Joint Exercises Ankle Circles/Pumps: AROM;Both;10 reps;Seated Quad Sets: AROM;Strengthening;Left;10 reps;Seated Long Arc Quad: AROM;Strengthening;Left;10 reps;Seated Goniometric ROM: see LE assessment      Assessment/Plan    PT Assessment Patient needs continued PT services  PT Diagnosis Difficulty walking;Generalized weakness;Acute pain   PT Problem List Decreased strength;Decreased range of motion;Decreased activity tolerance;Decreased balance;Decreased mobility;Decreased knowledge of use of DME;Decreased cognition;Decreased safety awareness;Decreased knowledge of precautions;Pain  PT Treatment Interventions DME instruction;Gait training;Functional mobility training;Therapeutic activities;Therapeutic exercise;Balance training;Neuromuscular re-education;Patient/family education   PT Goals (Current goals can be found in the Care Plan section) Acute Rehab PT Goals Patient Stated Goal: to get rehab then go home PT Goal Formulation: With patient Time For Goal Achievement: 12/12/13 Potential to Achieve Goals: Good    Frequency 7X/week   Barriers to discharge Decreased caregiver support wife will be at work during the day    Co-evaluation               End of Session Equipment Utilized During Treatment: Right knee immobilizer;Gait belt Activity Tolerance: Patient limited by fatigue Patient left: in  chair;with call bell/phone within reach Nurse Communication: Mobility status;Precautions;Weight bearing status         Time: 1610-9604 PT Time Calculation (min): 21 min   Charges:   PT Evaluation $Initial PT Evaluation Tier I: 1 Procedure PT Treatments $Gait Training: 8-22 mins   PT G CodesDonell Sievert, [place]  [phone] 12/05/2013, 9:24 AM

## 2013-12-05 NOTE — Progress Notes (Signed)
CARE MANAGEMENT NOTE 12/05/2013  Patient:  Justin Choi, Justin Choi   Account Number:  [id number]  Date Initiated:  12/05/2013  Documentation initiated by:  Jiles Crocker  Subjective/Objective Assessment:   ADMITTED FOR SURGERY     Action/Plan:   CM FOLLOWING FOR DCP   Anticipated DC Date:  12/08/2013   Anticipated DC Plan:  SKILLED NURSING FACILITY  In-house referral  Clinical Social Worker      DC Planning Services  CM consult         Status of service:  In process, will continue to follow Medicare Important Message given?   (If response is "NO", the following Medicare IM given date fields will be blank)  Per UR Regulation:  Reviewed for med. necessity/level of care/duration of stay  Comments:  9/23/2015Abelino Derrick RN,BSN,MHA [phone]

## 2013-12-05 NOTE — Clinical Social Work Placement (Addendum)
Clinical Social Work Department CLINICAL SOCIAL WORK PLACEMENT NOTE 12/05/2013  Patient:  Justin Choi, Justin Choi  Account Number:  [id number] Admit date:  12/04/2013  Clinical Social Worker:  Gwynne Edinger, LCSWA  Date/time:  12/05/2013 03:38 PM  Clinical Social Work is seeking post-discharge placement for this patient at the following level of care:   SKILLED NURSING   (*CSW will update this form in Epic as items are completed)   12/05/2013  Patient/family provided with [facility] Department of Clinical Social Work's list of facilities offering this level of care within the geographic area requested by the patient (or if unable, by the patient's family).  12/05/2013  Patient/family informed of their freedom to choose among providers that offer the needed level of care, that participate in Medicare, Medicaid or managed care program needed by the patient, have an available bed and are willing to accept the patient.  12/05/2013  Patient/family informed of [facility]' ownership interest in [facility], as well as of the fact that they are under no obligation to receive care at this facility.  PASARR submitted to EDS on 12/05/2013 PASARR number received on 12/05/2013  FL2 transmitted to all facilities in geographic area requested by pt/family on  12/05/2013 FL2 transmitted to all facilities within larger geographic area on 12/05/2013  Patient informed that his/her managed care company has contracts with or will negotiate with  certain facilities, including the following:     Patient/family informed of bed offers received:  12/06/2013 Patient chooses bed at [facility]  Physician recommends and patient chooses bed at    Patient to be transferred to [facility]  on  12/08/2013 Patient to be transferred to facility by [facility] Patient and family notified of transfer on 12/07/2013 Name of family member notified:  Pt and wife, Justin Choi  The following physician request  were entered in Epic:   Additional Comments: Dysheka Bibbs, MSW, LCSWA [phone]

## 2013-12-06 LAB — CBC
HCT: 33.1 % — ABNORMAL LOW (ref 39.0–52.0)
Hemoglobin: 11.4 g/dL — ABNORMAL LOW (ref 13.0–17.0)
MCH: 29.5 pg (ref 26.0–34.0)
MCHC: 34.4 g/dL (ref 30.0–36.0)
MCV: 85.5 fL (ref 78.0–100.0)
Platelets: 177 10*3/uL (ref 150–400)
RBC: 3.87 MIL/uL — ABNORMAL LOW (ref 4.22–5.81)
RDW: 13.7 % (ref 11.5–15.5)
WBC: 11.2 10*3/uL — ABNORMAL HIGH (ref 4.0–10.5)

## 2013-12-06 LAB — PROTIME-INR
INR: 1.38 (ref 0.00–1.49)
Prothrombin Time: 17 seconds — ABNORMAL HIGH (ref 11.6–15.2)

## 2013-12-06 MED ORDER — WARFARIN SODIUM 5 MG PO TABS: 5.0000 mg | ORAL_TABLET | Freq: Every day | ORAL | Status: DC

## 2013-12-06 MED ORDER — DSS 100 MG PO CAPS: 100.0000 mg | ORAL_CAPSULE | Freq: Two times a day (BID) | ORAL | Status: AC

## 2013-12-06 MED ORDER — METHOCARBAMOL 500 MG PO TABS: 500.0000 mg | ORAL_TABLET | Freq: Four times a day (QID) | ORAL | Status: DC | PRN

## 2013-12-06 MED ORDER — WARFARIN SODIUM 7.5 MG PO TABS
7.5000 mg | ORAL_TABLET | Freq: Once | ORAL | Status: AC
  Administered 2013-12-06: 7.5 mg via ORAL
  Filled 2013-12-06: qty 1

## 2013-12-06 MED ORDER — OXYCODONE HCL 5 MG PO TABS: 5.0000 mg | ORAL_TABLET | ORAL | Status: DC | PRN

## 2013-12-06 NOTE — Progress Notes (Signed)
ANTICOAGULATION CONSULT NOTE - Follow up  Pharmacy Consult for Warfarin Indication: VTE prophylaxis  No Known Allergies  Patient Measurements: Height:  (177.8 cm) Weight: 258 lb (117.028 kg) IBW/kg (Calculated) : 73   Vital Signs: Temp: 97.9 F (36.6 C) (09/24 1330) Temp src: Oral (09/24 1330) BP: 159/78 mmHg (09/24 1330) Pulse Rate: 76 (09/24 1330)  Labs:  Recent Labs  12/05/13 0445 12/06/13 0735  HGB 12.0* 11.4*  HCT 35.3* 33.1*  PLT 191 177  LABPROT 13.9 17.0*  INR 1.07 1.38  CREATININE 0.89  --     Estimated Creatinine Clearance: 110.3 ml/min (by C-G formula based on Cr of 0.89).   Medical History: Past Medical History  Diagnosis Date  . Hypertension   . Gout      Assessment: INR is 1.38 today in this 63yo male on coumadin for VTE prophylaxis s/p L TKA.Marland Kitchen His weight = 117kg.  Baseline INR 0.98.  Today INR is 1.07 this AM. CBC decreased /stable with Hgb 11.4, pltc 177K.   No bleeding noted.      Goal of Therapy:  INR 2-3 Monitor platelets by anticoagulation protocol: Yes   Plan:  Warfarin 7.5mg  x1 Daily INR    Noah Delaine, RPh Clinical Pharmacist Pager: [phone] 12/06/2013 3:21 PM

## 2013-12-06 NOTE — Discharge Summary (Signed)
Physician Discharge Summary  Patient ID: Justin Choi MRN: [id number] DOB/AGE: [birth year] 63 y.o.  Admit date: 12/04/2013 Discharge date: 12/07/2013 Admission Diagnoses:  Active Problems:   Arthritis of knee   Discharge Diagnoses:  Same  Surgeries: Procedure(s): LEFT TOTAL KNEE ARTHROPLASTY on 12/04/2013   Consultants:    Discharged Condition: Stable  Hospital Course: JAYDIAN SANTANA is an 63 y.o. male who was admitted 12/04/2013 with a chief complaint of knee pain, and found to have a diagnosis of knee arthritis.  They were brought to the operating room on 12/04/2013 and underwent the above named procedures. He was mobilized with PT on POD 1 and also started cpm at that time. Incision ok at DC   Antibiotics given:  Anti-infectives   Start     Dose/Rate Route Frequency Ordered Stop   12/05/13 0000  ceFAZolin (ANCEF) IVPB 2 g/50 mL premix     2 g 100 mL/hr over 30 Minutes Intravenous Every 8 hours 12/04/13 2155 12/05/13 0920   12/04/13 0600  ceFAZolin (ANCEF) IVPB 2 g/50 mL premix     2 g 100 mL/hr over 30 Minutes Intravenous On call to O.R. 12/03/13 1430 12/04/13 1615    .  Recent vital signs:  Filed Vitals:   12/06/13 1600  BP:   Pulse:   Temp:   Resp: 20    Recent laboratory studies:  Results for orders placed during the hospital encounter of 12/04/13  URINALYSIS, ROUTINE W REFLEX MICROSCOPIC      Result Value Ref Range   Color, Urine YELLOW  YELLOW   APPearance CLEAR  CLEAR   Specific Gravity, Urine 1.018  1.005 - 1.030   pH 6.0  5.0 - 8.0   Glucose, UA NEGATIVE  NEGATIVE mg/dL   Hgb urine dipstick TRACE (*) NEGATIVE   Bilirubin Urine NEGATIVE  NEGATIVE   Ketones, ur NEGATIVE  NEGATIVE mg/dL   Protein, ur NEGATIVE  NEGATIVE mg/dL   Urobilinogen, UA 1.0  0.0 - 1.0 mg/dL   Nitrite NEGATIVE  NEGATIVE   Leukocytes, UA NEGATIVE  NEGATIVE  URINE MICROSCOPIC-ADD ON      Result Value Ref Range   WBC, UA 0-2  <3 WBC/hpf   RBC / HPF 0-2  <3 RBC/hpf   Casts  HYALINE CASTS (*) NEGATIVE   Urine-Other MUCOUS PRESENT    PROTIME-INR      Result Value Ref Range   Prothrombin Time 13.9  11.6 - 15.2 seconds   INR 1.07  0.00 - 1.49  CBC      Result Value Ref Range   WBC 11.0 (*) 4.0 - 10.5 K/uL   RBC 4.05 (*) 4.22 - 5.81 MIL/uL   Hemoglobin 12.0 (*) 13.0 - 17.0 g/dL   HCT 19.1 (*) 47.8 - 29.5 %   MCV 87.2  78.0 - 100.0 fL   MCH 29.6  26.0 - 34.0 pg   MCHC 34.0  30.0 - 36.0 g/dL   RDW 62.1  30.8 - 65.7 %   Platelets 191  150 - 400 K/uL  BASIC METABOLIC PANEL      Result Value Ref Range   Sodium 138  137 - 147 mEq/L   Potassium 4.3  3.7 - 5.3 mEq/L   Chloride 99  96 - 112 mEq/L   CO2 24  19 - 32 mEq/L   Glucose, Bld 160 (*) 70 - 99 mg/dL   BUN 16  6 - 23 mg/dL   Creatinine, Ser 8.46  0.50 - 1.35 mg/dL  Calcium 8.3 (*) 8.4 - 10.5 mg/dL   GFR calc non Af Amer 90 (*) >90 mL/min   GFR calc Af Amer >90  >90 mL/min   Anion gap 15  5 - 15  PROTIME-INR      Result Value Ref Range   Prothrombin Time 17.0 (*) 11.6 - 15.2 seconds   INR 1.38  0.00 - 1.49  CBC      Result Value Ref Range   WBC 11.2 (*) 4.0 - 10.5 K/uL   RBC 3.87 (*) 4.22 - 5.81 MIL/uL   Hemoglobin 11.4 (*) 13.0 - 17.0 g/dL   HCT 16.1 (*) 09.6 - 04.5 %   MCV 85.5  78.0 - 100.0 fL   MCH 29.5  26.0 - 34.0 pg   MCHC 34.4  30.0 - 36.0 g/dL   RDW 40.9  81.1 - 91.4 %   Platelets 177  150 - 400 K/uL    Discharge Medications:     Medication List    STOP taking these medications       ALEVE 220 MG tablet  Generic drug:  naproxen sodium      TAKE these medications       bisoprolol 5 MG tablet  Commonly known as:  ZEBETA  Take 5 mg by mouth daily.     DSS 100 MG Caps  Take 100 mg by mouth 2 (two) times daily.     FISH OIL PO  Take 1 capsule by mouth daily.     methocarbamol 500 MG tablet  Commonly known as:  ROBAXIN  Take 1 tablet (500 mg total) by mouth every 6 (six) hours as needed for muscle spasms.     multivitamin with minerals Tabs tablet  Take 1 tablet by  mouth daily.     oxyCODONE 5 MG immediate release tablet  Commonly known as:  Oxy IR/ROXICODONE  Take 1-2 tablets (5-10 mg total) by mouth every 3 (three) hours as needed for breakthrough pain.     warfarin 5 MG tablet  Commonly known as:  COUMADIN  Take 1 tablet (5 mg total) by mouth daily.        Diagnostic Studies: Dg Chest 2 View  11/27/2013   CLINICAL DATA:  63 year old male presents for preoperative chest x-ray. Knee replacement surgery. No history of smoking.  EXAM: CHEST  2 VIEW  COMPARISON:  None.  FINDINGS: Cardiac silhouette projects within normal limits in size and contour.  Pronounced left lateral margin of the descending thoracic aorta with double density overlying the left heart.  No evidence of pulmonary vascular congestion.  No confluent airspace disease, pneumothorax, pleural effusion.  Pleural parenchymal thickening.  No displaced fracture.  Unremarkable appearance of the upper abdomen.  Flowing osteophyte production of the thoracic spine.  IMPRESSION: No radiographic evidence of acute cardiopulmonary disease.  Tortuosity of the thoracic aorta compatible with longstanding hypertension.  Signed,  Yvone Neu. Loreta Ave, DO  [facility]  [facility]   Electronically Signed   By: Gilmer Mor O.D.   On: 11/27/2013 16:34    Disposition: Final discharge disposition not confirmed      Discharge Instructions   Call MD / Call 911    Complete by:  As directed   If you experience chest pain or shortness of breath, CALL 911 and be transported to the hospital emergency room.  If you develope a fever above 101 F, pus (white drainage) or increased drainage or redness at the wound, or calf pain,  call your surgeon's office.     Constipation Prevention    Complete by:  As directed   Drink plenty of fluids.  Prune juice may be helpful.  You may use a stool softener, such as Colace (over the counter) 100 mg twice a day.  Use MiraLax (over the  counter) for constipation as needed.     Diet - low sodium heart healthy    Complete by:  As directed      Discharge instructions    Complete by:  As directed   Weight bearing as tolerated with crutches Knee range of motion as tolerated Keep incision dry     Increase activity slowly as tolerated    Complete by:  As directed      Weight bearing as tolerated    Complete by:  As directed               Signed: Lissa Rowles SCOTT 12/06/2013, 6:01 PM

## 2013-12-06 NOTE — Discharge Instructions (Signed)
Information on my medicine - Coumadin®   (Warfarin) ° °This medication education was reviewed with me or my healthcare representative as part of my discharge preparation.  The pharmacist that spoke with me during my hospital stay was:  Garrie Woodin Prescott, RPH ° °Why was Coumadin prescribed for you? °Coumadin was prescribed for you because you have a blood clot or a medical condition that can cause an increased risk of forming blood clots. Blood clots can cause serious health problems by blocking the flow of blood to the heart, lung, or brain. Coumadin can prevent harmful blood clots from forming. °As a reminder your indication for Coumadin is:   Select from menu ° °What test will check on my response to Coumadin? °While on Coumadin (warfarin) you will need to have an INR test regularly to ensure that your dose is keeping you in the desired range. The INR (international normalized ratio) number is calculated from the result of the laboratory test called prothrombin time (PT). ° °If an INR APPOINTMENT HAS NOT ALREADY BEEN MADE FOR YOU please schedule an appointment to have this lab work done by your health care provider within 7 days. °Your INR goal is usually a number between:  2 to 3 or your provider may give you a more narrow range like 2-2.5.  Ask your health care provider during an office visit what your goal INR is. ° °What  do you need to  know  About  COUMADIN? °Take Coumadin (warfarin) exactly as prescribed by your healthcare provider about the same time each day.  DO NOT stop taking without talking to the doctor who prescribed the medication.  Stopping without other blood clot prevention medication to take the place of Coumadin may increase your risk of developing a new clot or stroke.  Get refills before you run out. ° °What do you do if you miss a dose? °If you miss a dose, take it as soon as you remember on the same day then continue your regularly scheduled regimen the next day.  Do not take two doses  of Coumadin at the same time. ° °Important Safety Information °A possible side effect of Coumadin (Warfarin) is an increased risk of bleeding. You should call your healthcare provider right away if you experience any of the following: °  Bleeding from an injury or your nose that does not stop. °  Unusual colored urine (red or dark brown) or unusual colored stools (red or black). °  Unusual bruising for unknown reasons. °  A serious fall or if you hit your head (even if there is no bleeding). ° °Some foods or medicines interact with Coumadin® (warfarin) and might alter your response to warfarin. To help avoid this: °  Eat a balanced diet, maintaining a consistent amount of Vitamin K. °  Notify your provider about major diet changes you plan to make. °  Avoid alcohol or limit your intake to 1 drink for women and 2 drinks for men per day. °(1 drink is 5 oz. wine, 12 oz. beer, or 1.5 oz. liquor.) ° °Make sure that ANY health care provider who prescribes medication for you knows that you are taking Coumadin (warfarin).  Also make sure the healthcare provider who is monitoring your Coumadin knows when you have started a new medication including herbals and non-prescription products. ° °Coumadin® (Warfarin)  Major Drug Interactions  °Increased Warfarin Effect Decreased Warfarin Effect  °Alcohol (large quantities) °Antibiotics (esp. Septra/Bactrim, Flagyl, Cipro) °Amiodarone (Cordarone) °Aspirin (ASA) °Cimetidine (Tagamet) °  Megestrol (Megace) °NSAIDs (ibuprofen, naproxen, etc.) °Piroxicam (Feldene) °Propafenone (Rythmol SR) °Propranolol (Inderal) °Isoniazid (INH) °Posaconazole (Noxafil) Barbiturates (Phenobarbital) °Carbamazepine (Tegretol) °Chlordiazepoxide (Librium) °Cholestyramine (Questran) °Griseofulvin °Oral Contraceptives °Rifampin °Sucralfate (Carafate) °Vitamin K  ° °Coumadin® (Warfarin) Major Herbal Interactions  °Increased Warfarin Effect Decreased Warfarin Effect  °Garlic °Ginseng °Ginkgo biloba Coenzyme  Q10 °Green tea °St. John’s wort   ° °Coumadin® (Warfarin) FOOD Interactions  °Eat a consistent number of servings per week of foods HIGH in Vitamin K °(1 serving = ½ cup)  °Collards (cooked, or boiled & drained) °Kale (cooked, or boiled & drained) °Mustard greens (cooked, or boiled & drained) °Parsley *serving size only = ¼ cup °Spinach (cooked, or boiled & drained) °Swiss chard (cooked, or boiled & drained) °Turnip greens (cooked, or boiled & drained)  °Eat a consistent number of servings per week of foods MEDIUM-HIGH in Vitamin K °(1 serving = 1 cup)  °Asparagus (cooked, or boiled & drained) °Broccoli (cooked, boiled & drained, or raw & chopped) °Brussel sprouts (cooked, or boiled & drained) *serving size only = ½ cup °Lettuce, raw (green leaf, endive, romaine) °Spinach, raw °Turnip greens, raw & chopped  ° °These websites have more information on Coumadin (warfarin):  www.coumadin.com; °www.ahrq.gov/consumer/coumadin.htm; ° ° °

## 2013-12-06 NOTE — Progress Notes (Signed)
Subjective: Pt stable - moving ok   Objective: Vital signs in last 24 hours: Temp:  [97.9 F (36.6 C)-99.1 F (37.3 C)] 97.9 F (36.6 C) (09/24 1330) Pulse Rate:  [72-91] 76 (09/24 1330) Resp:  [16-20] 20 (09/24 1600) BP: (141-162)/(71-87) 159/78 mmHg (09/24 1330) SpO2:  [90 %-99 %] 98 % (09/24 1600)  Intake/Output from previous day: 09/23 0701 - 09/24 0700 In: 1965 [P.O.:840; I.V.:1125] Out: 1350 [Urine:1350] Intake/Output this shift: Total I/O In: 600 [P.O.:600] Out: 475 [Urine:475]  Exam:  Neurovascular intact Sensation intact distally Intact pulses distally  Labs:  Recent Labs  12/05/13 0445 12/06/13 0735  HGB 12.0* 11.4*    Recent Labs  12/05/13 0445 12/06/13 0735  WBC 11.0* 11.2*  RBC 4.05* 3.87*  HCT 35.3* 33.1*  PLT 191 177    Recent Labs  12/05/13 0445  NA 138  K 4.3  CL 99  CO2 24  BUN 16  CREATININE 0.89  GLUCOSE 160*  CALCIUM 8.3*    Recent Labs  12/05/13 0445 12/06/13 0735  INR 1.07 1.38    Assessment/Plan: Plan snf am   Justin Choi 12/06/2013, 5:58 PM

## 2013-12-06 NOTE — Progress Notes (Signed)
Physical Therapy Treatment Patient Details Name: Justin Choi MRN: [id number] DOB: 27-Aug-1950 Today's Date: 12/06/2013    History of Present Illness s/p Lt TKA 12/04/13.    PT Comments    Pt greatly limited with knee flexion ROM and exercises due to pain. Limited ambulation to chair only today secondary to c/o dizziness. Cont to recommend SNF for post acute rehab.   Follow Up Recommendations  SNF;Supervision/Assistance - 24 hour     Equipment Recommendations  3in1 (PT)    Recommendations for Other Services OT consult     Precautions / Restrictions Precautions Precautions: Fall;Knee Required Braces or Orthoses: Knee Immobilizer - Left Knee Immobilizer - Left: On when out of bed or walking Restrictions Weight Bearing Restrictions: Yes LLE Weight Bearing: Weight bearing as tolerated    Mobility  Bed Mobility Overal bed mobility: Needs Assistance Bed Mobility: Supine to Sit     Supine to sit: Min assist;HOB elevated     General bed mobility comments: pt reaching for UE support to elevate trunk; requires incr time and max cueing   Transfers Overall transfer level: Needs assistance Equipment used: Rolling walker (2 wheeled) Transfers: Sit to/from Stand Sit to Stand: Min assist;From elevated surface         General transfer comment: bed continues to be elevated to (A) pt elevate trunk; max multimodal cues for upright posture and hand placement  Ambulation/Gait Ambulation/Gait assistance: Mod assist Ambulation Distance (Feet): 10 Feet Assistive device: Rolling walker (2 wheeled) Gait Pattern/deviations: Step-to pattern;Decreased stance time - left;Decreased step length - right;Antalgic;Wide base of support;Trunk flexed Gait velocity: very decreased Gait velocity interpretation: Below normal speed for age/gender General Gait Details: pt limited with mobility today secondary to dizziness and fatigue; pt unsteady and required mod (A) with support through gt belt;  decr step length and ability to WB through Lt LE today   Stairs            Wheelchair Mobility    Modified Rankin (Stroke Patients Only)       Balance Overall balance assessment: Needs assistance Sitting-balance support: Feet supported;Single extremity supported Sitting balance-Leahy Scale: Poor Sitting balance - Comments: c/o dizziness initially; bracing himself due to pain Postural control: Right lateral lean Standing balance support: Bilateral upper extremity supported;During functional activity Standing balance-Leahy Scale: Poor Standing balance comment: heavy lean Rt                    Cognition Arousal/Alertness: Awake/alert Behavior During Therapy: Flat affect Overall Cognitive Status: No family/caregiver present to determine baseline cognitive functioning Area of Impairment: Problem solving     Memory: Decreased short-term memory       Problem Solving: Slow processing;Difficulty sequencing;Requires verbal cues General Comments: pt continues to have difficulties sequencing and processing motor commands    Exercises Total Joint Exercises Ankle Circles/Pumps: AROM;Both;10 reps;Supine Quad Sets: AROM;Strengthening;Left;10 reps;Seated Heel Slides: AAROM;Left;10 reps;Seated;Other (comment) (with use of sheet) Hip ABduction/ADduction: AAROM;Left;10 reps;Seated Long Arc Quad: 5 reps;AAROM;Strengthening;Left;Seated;Other (comment) (limited by pain) Goniometric ROM: knee flexion greatly limited by pain; AROM in sitting -4 to 30 degrees (with ACE wrap on)     General Comments        Pertinent Vitals/Pain Pain Assessment: 0-10 Pain Score: 5  Pain Location: Lt knee Pain Descriptors / Indicators: Constant;Burning Pain Intervention(s): Monitored during session;Repositioned;Premedicated before session;Ice applied    Home Living                      Prior Function  PT Goals (current goals can now be found in the care plan section)  Acute Rehab PT Goals Patient Stated Goal: to get up PT Goal Formulation: With patient Time For Goal Achievement: 12/12/13 Potential to Achieve Goals: Good Progress towards PT goals: Progressing toward goals    Frequency  7X/week    PT Plan Current plan remains appropriate    Co-evaluation             End of Session Equipment Utilized During Treatment: Left knee immobilizer;Gait belt Activity Tolerance: Patient limited by fatigue;Patient limited by pain Patient left: in chair;with call bell/phone within reach     Time: 0925-0949 PT Time Calculation (min): 24 min  Charges:  $Gait Training: 8-22 mins $Therapeutic Exercise: 8-22 mins                    G CodesDonell Sievert, War  [phone] 12/06/2013, 11:28 AM

## 2013-12-06 NOTE — Progress Notes (Signed)
Orthopedic Tech Progress Note Patient Details:  Justin Choi Jul 18, 1950 [id number]  Patient ID: Justin Choi, male   DOB: 1950-05-31, 63 y.o.   MRN: [id number] Placed pt's lle in cpm  @ 0-30 degrees  Nikki Dom 12/06/2013, 6:39 PM

## 2013-12-06 NOTE — Op Note (Signed)
NAME:  MARVEN, VELEY NO.:  [id number]  MEDICAL RECORD NO.:  [id number]  LOCATION:                                 FACILITY:  PHYSICIAN:  Burnard Bunting, M.D.    DATE OF BIRTH:  Aug 25, 1950  DATE OF PROCEDURE:  12/04/2013 DATE OF DISCHARGE:  12/04/2013                              OPERATIVE REPORT   PREOPERATIVE DIAGNOSIS:  Left knee arthritis.  POSTOPERATIVE DIAGNOSIS:  Left knee arthritis.  PROCEDURE:  Left total knee replacement.  SURGEON:  Burnard Bunting, M.D.  ASSISTANT:  Wende Neighbors, P.A.  ANESTHESIA:  General.  IMPLANTS UTILIZED:  [employer]ker Triathlon posterior cruciate retaining 6 femur, 6 tibia, 11 poly insert, 38-mm 3 pegged cemented patella.  INDICATIONS:  Valentine is a patient with end-stage knee arthritis, presents for total knee replacement after explanation of risks and benefits.  PROCEDURE IN DETAIL:  The patient was brought to operating room where general endotracheal anesthesia was induced.  Preoperative antibiotics was administered.  Time-out was called.  Left leg was prescrubbed with alcohol and Betadine, and allowed to air dry, prepped with DuraPrep solution and draped in sterile manner.  Collier Flowers was used to cover the operative field.  Leg was elevated and exsanguinated with Esmarch wrap. Tourniquet was inflated.  Anterior approach to knee was made.  Median parapatellar approach was made.  Precise location marked with #1 Vicryl suture.  Anterior soft tissue removed from the anterior distal femur. The patient had a significant synovitis within the suprapatellar pouch, which was removed.  The fat pad was partially excised, minimal soft tissue dissection performed medially.  At this time, ACL was released. Intramedullary alignment was used to cut 9 mm off the tibia. Collaterals and posterior neurovascular structures and PCL protected. Intramedullary alignment was then used to resect the distal femur, which was 10 mm based on preoperative  flexion contracture of over 10 degrees. This allowed 9 mm spacer to fit well at full extension.  The femur was then sized 3 degrees of external rotation, placed, size 6 was obtained. Posterior, anterior and chamfer cuts were performed.  Tibia was keel punched.  Patella was prepared.  Trial components placed.  The patient had full extension, full flexion, good stability and varus and valgus stress at 0, 30 and 90 degrees with an 11-mm spacer.  Implants were removed.  Thorough irrigation performed 3 liters of irrigating solution. Exparel was utilized to anesthetize the capsule.  At this time, components were cemented into position.  Excess cement was removed. Same stability parameters were maintained.  Patella tracked well after minimal lateral release.  Thorough irrigation was then performed after the tourniquet was released.  Minimal bleeding points encounter were controlled using electrocautery.  Incision then was closed over bolster using interrupted inverted #1 Vicryl suture, 0 Vicryl suture, 2-0 Vicryl suture and skin staples.  Solution of plain Marcaine was used to anesthetize the skin.  Marcaine, morphine, and clonidine injected into the joint for postop pain relief.  Velna Hatchet Vernon's assistance required during the case for opening and retraction of neurovascular structures, cementing and removing the cement.  Her assistance was a medical necessity.  The patient was placed in knee immobilizer,  transferred to the recovery room in stable condition.     Burnard Bunting, M.D.     GSD/MEDQ  D:  12/04/2013  T:  12/05/2013  Job:  [id number]

## 2013-12-07 LAB — PROTIME-INR
INR: 2.05 — ABNORMAL HIGH (ref 0.00–1.49)
Prothrombin Time: 23.1 seconds — ABNORMAL HIGH (ref 11.6–15.2)

## 2013-12-07 LAB — CBC
HCT: 32 % — ABNORMAL LOW (ref 39.0–52.0)
Hemoglobin: 11.2 g/dL — ABNORMAL LOW (ref 13.0–17.0)
MCH: 29.6 pg (ref 26.0–34.0)
MCHC: 35 g/dL (ref 30.0–36.0)
MCV: 84.4 fL (ref 78.0–100.0)
Platelets: 174 10*3/uL (ref 150–400)
RBC: 3.79 MIL/uL — ABNORMAL LOW (ref 4.22–5.81)
RDW: 13.7 % (ref 11.5–15.5)
WBC: 10.9 10*3/uL — AB (ref 4.0–10.5)

## 2013-12-07 MED ORDER — WARFARIN SODIUM 5 MG PO TABS
5.0000 mg | ORAL_TABLET | Freq: Once | ORAL | Status: AC
  Administered 2013-12-07: 5 mg via ORAL
  Filled 2013-12-07: qty 1

## 2013-12-07 NOTE — Progress Notes (Signed)
Physical Therapy Treatment Patient Details Name: Justin Choi MRN: [id number] DOB: September 01, 1950 Today's Date: 12/07/2013    History of Present Illness s/p Lt TKA 12/04/13.    PT Comments    Pt very limited with mobility today due to pain and dizziness. Encouraged pt OOB for all meals. Pt with difficulty recalling education provided in previous session. Educated nursing on technique for transfer back to bed safely with use of gt belt. Recommend SNF for post acute care.   Follow Up Recommendations  SNF;Supervision/Assistance - 24 hour     Equipment Recommendations  3in1 (PT)    Recommendations for Other Services OT consult     Precautions / Restrictions Precautions Precautions: Fall;Knee Required Braces or Orthoses: Knee Immobilizer - Left Knee Immobilizer - Left: On when out of bed or walking Restrictions Weight Bearing Restrictions: Yes LLE Weight Bearing: Weight bearing as tolerated    Mobility  Bed Mobility Overal bed mobility: Needs Assistance Bed Mobility: Supine to Sit     Supine to sit: Min assist;HOB elevated     General bed mobility comments: pt requires (A) to elevate trunk to sitting position; pt demo difficulty advancing Lt LE off EOB   Transfers Overall transfer level: Needs assistance Equipment used: Rolling walker (2 wheeled) Transfers: Sit to/from Stand Sit to Stand: Min assist;From elevated surface         General transfer comment: pt with difficulty pushing up to stand; max cues for hand placement and sequencing  Ambulation/Gait Ambulation/Gait assistance: Min assist Ambulation Distance (Feet): 14 Feet Assistive device: Rolling walker (2 wheeled) Gait Pattern/deviations: Step-to pattern;Decreased stance time - left;Decreased step length - right;Antalgic;Trunk flexed;Wide base of support Gait velocity: very decreased Gait velocity interpretation: Below normal speed for age/gender General Gait Details: pt limited due to dizziness; pt  requries max cues for gt sequencing and upright posture; pt requires (A) to maintain balance and manage RW   Stairs            Wheelchair Mobility    Modified Rankin (Stroke Patients Only)       Balance Overall balance assessment: Needs assistance Sitting-balance support: Feet supported;Single extremity supported Sitting balance-Leahy Scale: Poor Sitting balance - Comments: pt heavily leaning to Rt; c/o dizziness initially but subsided with ~5 min  Postural control: Right lateral lean Standing balance support: During functional activity;Bilateral upper extremity supported Standing balance-Leahy Scale: Poor Standing balance comment: relies on RW and min (A) ; tolerated standing to void x 2 min; heavy lean to Rt                    Cognition Arousal/Alertness: Awake/alert Behavior During Therapy: Flat affect Overall Cognitive Status: No family/caregiver present to determine baseline cognitive functioning Area of Impairment: Problem solving     Memory: Decreased short-term memory       Problem Solving: Slow processing;Difficulty sequencing;Requires verbal cues General Comments: no family present; pt continues to have difficulty recalling precautions (no pillow under knee) and recalling previous instructions and education from previous sessions    Exercises Total Joint Exercises Ankle Circles/Pumps: AROM;Both;10 reps;Seated Quad Sets: AROM;Strengthening;Left;10 reps;Seated Goniometric ROM: knee flexion AROM in sitting 25 degrees limited by pain     General Comments General comments (skin integrity, edema, etc.): BP 126/72; continued to reinforce no pillow under knee      Pertinent Vitals/Pain Pain Assessment: 0-10 Pain Score: 5  Pain Location: Lt knee Pain Descriptors / Indicators: Aching;Operative site guarding Pain Intervention(s): Limited activity within patient's tolerance;Monitored during session;Repositioned  Home Living                       Prior Function            PT Goals (current goals can now be found in the care plan section) Acute Rehab PT Goals Patient Stated Goal: to not lay in this bed so much PT Goal Formulation: With patient Time For Goal Achievement: 12/12/13 Potential to Achieve Goals: Good Progress towards PT goals: Progressing toward goals    Frequency  7X/week    PT Plan Current plan remains appropriate    Co-evaluation             End of Session Equipment Utilized During Treatment: Left knee immobilizer;Gait belt Activity Tolerance: Patient limited by fatigue;Patient limited by pain Patient left: in chair;with call bell/phone within reach     Time: 1610-9604 PT Time Calculation (min): 24 min  Charges:  $Gait Training: 8-22 mins $Therapeutic Exercise: 8-22 mins                    G CodesDonell Sievert,   [phone] 12/07/2013, 12:58 PM

## 2013-12-07 NOTE — Clinical Social Work Note (Signed)
CSW met pt at bedside. CSW introduce self and purpose of visit. CSW provided pt with his bed offer on 12/06/2013. Pt reported his wife has not decided on which SNF he should go to. Pt reported his wife is still working on making a decision from the list of bed offered presented to him yesterday. CSW ask the pt for permission to call his wife. The pt agreed. CSW attempted to contracted the wife on her cell, house and work phone. CSW left a voice mail on the pt's wife's cell phone. CSW is awaiting a call back.   [place], MSW, [place]

## 2013-12-07 NOTE — Progress Notes (Signed)
ANTICOAGULATION CONSULT NOTE - Follow up  Pharmacy Consult for Warfarin Indication: VTE prophylaxis  No Known Allergies  Patient Measurements: Height:  (177.8 cm) Weight: 258 lb (117.028 kg) IBW/kg (Calculated) : 73   Vital Signs: Temp: 99.1 F (37.3 C) (09/25 1058) Temp src: Oral (09/25 1058) BP: 120/76 mmHg (09/25 1058) Pulse Rate: 80 (09/25 1058)  Labs:  Recent Labs  12/05/13 0445 12/06/13 0735 12/07/13 0809  HGB 12.0* 11.4* 11.2*  HCT 35.3* 33.1* 32.0*  PLT 191 177 174  LABPROT 13.9 17.0* 23.1*  INR 1.07 1.38 2.05*  CREATININE 0.89  --   --     Estimated Creatinine Clearance: 110.3 ml/min (by C-G formula based on Cr of 0.89).   Assessment:  63yo male on coumadin for VTE prophylaxis s/p L TKA. INR is therapeutic at 2.05 after 3 doses of coumadin 7.5 mg given. Large 6.1 second jump in INR.  Coumadin education completed 9/24.  CBC stable, No bleeding noted.      Goal of Therapy:  INR 2-3 Monitor platelets by anticoagulation protocol: Yes   Plan:  Coumadin 5 mg po daily ordered on DC summary by MD.  Coumadin 5 mg po x 1 dose today Daily INR Herby Abraham, Pharm.D. [phone] 12/07/2013 2:37 PM

## 2013-12-08 LAB — PROTIME-INR
INR: 1.99 — ABNORMAL HIGH (ref 0.00–1.49)
Prothrombin Time: 22.6 seconds — ABNORMAL HIGH (ref 11.6–15.2)

## 2013-12-08 NOTE — Clinical Social Work Note (Signed)
Clinical Social Worker facilitated patient discharge including contacting patient family and facility to confirm patient discharge plans.  Clinical information faxed to facility and family agreeable with plan.  CSW arranged ambulance transport via [facility] to [facility].  RN to call report prior to discharge.  Clinical Social Worker will sign off for now as social work intervention is no longer needed. Please consult us again if new need arises.  Jesse Roniqua Kintz, LCSW [phone] 

## 2013-12-08 NOTE — Progress Notes (Signed)
Dressing c/d/i Exam stable AFVSS Dc to SNF today Rx in chart

## 2013-12-08 NOTE — Progress Notes (Signed)
Patient is discharged from room 4N31 at this time. Alert and in stable condition. IV site d/c'd per policy. Transported by Sharin Mons via stretcher to [facility] in [place] town. Report given to matt Hunt. All belonging are with patient on discharge.

## 2013-12-08 NOTE — Progress Notes (Signed)
Physical Therapy Treatment Patient Details Name: Justin Choi MRN: [id number] DOB: February 19, 1951 Today's Date: 01/03/14    History of Present Illness s/p Lt TKA 12/04/13.    PT Comments    Pt making steady progress with mobility.   Follow Up Recommendations  SNF;Supervision/Assistance - 24 hour     Equipment Recommendations  3in1 (PT)    Recommendations for Other Services OT consult     Precautions / Restrictions Precautions Precautions: Fall;Knee Required Braces or Orthoses: Knee Immobilizer - Left Knee Immobilizer - Left: On when out of bed or walking Restrictions LLE Weight Bearing: Weight bearing as tolerated    Mobility  Bed Mobility Overal bed mobility: Needs Assistance Bed Mobility: Supine to Sit     Supine to sit: Min assist     General bed mobility comments: bed flat and no rails. cues on sequence and technique. most assist needed with moving left leg to and off EOB.  Transfers Overall transfer level: Needs assistance Equipment used: Rolling walker (2 wheeled) Transfers: Sit to/from Stand Sit to Stand: Min assist;From elevated surface         General transfer comment: cues for hand placement and for ant weight shift with standing.  Ambulation/Gait Ambulation/Gait assistance: Min assist Ambulation Distance (Feet): 20 Feet Assistive device: Rolling walker (2 wheeled) Gait Pattern/deviations: Step-to pattern;Antalgic;Trunk flexed;Decreased step length - right;Decreased stance time - left;Shuffle         Stairs            Wheelchair Mobility    Modified Rankin (Stroke Patients Only)          Cognition Arousal/Alertness: Awake/alert Behavior During Therapy: WFL for tasks assessed/performed Overall Cognitive Status: Within Functional Limits for tasks assessed                 General Comments: no family present; pt continues to have difficulty recalling precautions (no pillow under knee) and recalling previous instructions  and education from previous sessions    Exercises Total Joint Exercises Ankle Circles/Pumps: AROM;Both;10 reps;Supine Quad Sets: AROM;Strengthening;Left;10 reps;Supine Heel Slides: AAROM;Strengthening;Left;10 reps;Supine Straight Leg Raises: AAROM;Strengthening;Left;10 reps;Supine Goniometric ROM: supine left knee flexion: 5-60 degrees AAROM    General Comments        Pertinent Vitals/Pain Pain Assessment: 0-10 Pain Score: 8  Pain Location: lt knee Pain Descriptors / Indicators: Aching;Sore Pain Intervention(s): Limited activity within patient's tolerance;Premedicated before session;Repositioned     PT Goals (current goals can now be found in the care plan section) Acute Rehab PT Goals Patient Stated Goal: to not lay in this bed so much PT Goal Formulation: With patient Time For Goal Achievement: 12/12/13 Potential to Achieve Goals: Good Progress towards PT goals: Progressing toward goals    Frequency  7X/week    PT Plan Current plan remains appropriate       End of Session Equipment Utilized During Treatment: Gait belt;Left knee immobilizer Activity Tolerance: Patient limited by pain;Patient limited by fatigue Patient left: in chair;with call bell/phone within reach     Time: 1045-1110 PT Time Calculation (min): 25 min  Charges:  $Gait Training: 8-22 mins $Therapeutic Exercise: 8-22 mins                    G Codes:      Sallyanne Kuster 01/03/2014, 1:59 PM  Sallyanne Kuster, PTA Office- [phone]

## 2014-06-03 ENCOUNTER — Other Ambulatory Visit: Payer: Self-pay | Admitting: Registered Nurse

## 2014-06-28 ENCOUNTER — Other Ambulatory Visit: Payer: Self-pay | Admitting: Dermatology

## 2015-08-01 DIAGNOSIS — N400 Enlarged prostate without lower urinary tract symptoms: Secondary | ICD-10-CM | POA: Diagnosis not present

## 2015-08-01 DIAGNOSIS — Z0000 Encounter for general adult medical examination without abnormal findings: Secondary | ICD-10-CM | POA: Diagnosis not present

## 2015-08-01 DIAGNOSIS — R972 Elevated prostate specific antigen [PSA]: Secondary | ICD-10-CM | POA: Diagnosis not present

## 2016-02-02 DIAGNOSIS — R351 Nocturia: Secondary | ICD-10-CM | POA: Diagnosis not present

## 2016-02-02 DIAGNOSIS — R972 Elevated prostate specific antigen [PSA]: Secondary | ICD-10-CM | POA: Diagnosis not present

## 2016-02-02 DIAGNOSIS — R3121 Asymptomatic microscopic hematuria: Secondary | ICD-10-CM | POA: Diagnosis not present

## 2016-02-02 DIAGNOSIS — R3915 Urgency of urination: Secondary | ICD-10-CM | POA: Diagnosis not present

## 2016-02-02 DIAGNOSIS — N401 Enlarged prostate with lower urinary tract symptoms: Secondary | ICD-10-CM | POA: Diagnosis not present

## 2016-02-16 DIAGNOSIS — R3121 Asymptomatic microscopic hematuria: Secondary | ICD-10-CM | POA: Diagnosis not present

## 2016-02-16 DIAGNOSIS — R3129 Other microscopic hematuria: Secondary | ICD-10-CM | POA: Diagnosis not present

## 2016-04-28 DIAGNOSIS — T07XXXA Unspecified multiple injuries, initial encounter: Secondary | ICD-10-CM | POA: Insufficient documentation

## 2016-05-18 DIAGNOSIS — Z7409 Other reduced mobility: Secondary | ICD-10-CM | POA: Insufficient documentation

## 2016-05-18 DIAGNOSIS — Z789 Other specified health status: Secondary | ICD-10-CM | POA: Insufficient documentation

## 2016-06-12 IMAGING — CR DG CHEST 2V
2 series · 2 of 2 positions shown · non-contrast
Comparison: None.

CLINICAL DATA: 62-year-old male presents for preoperative chest
x-ray. Knee replacement surgery. No history of smoking.

EXAM:
CHEST  2 VIEW

[w chest pa]
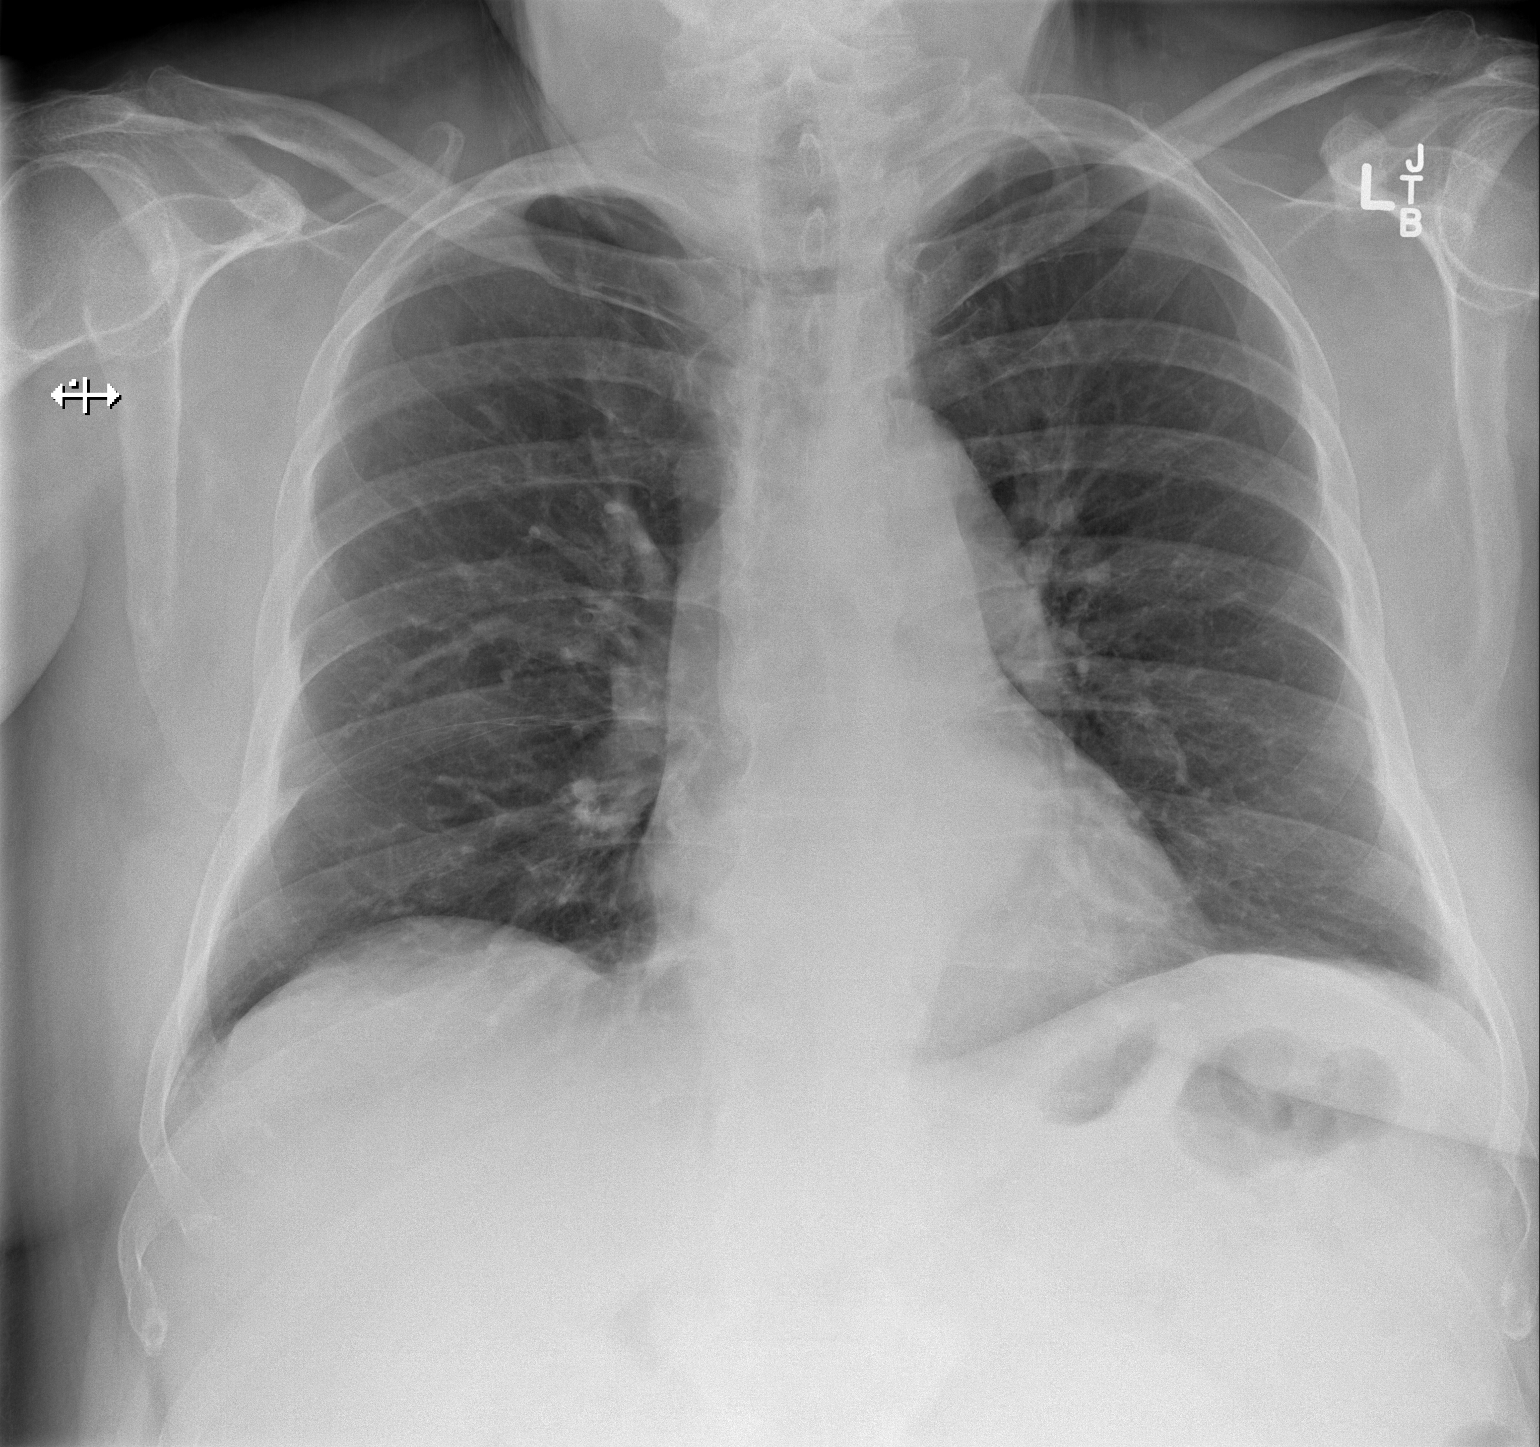

[w chest lat]
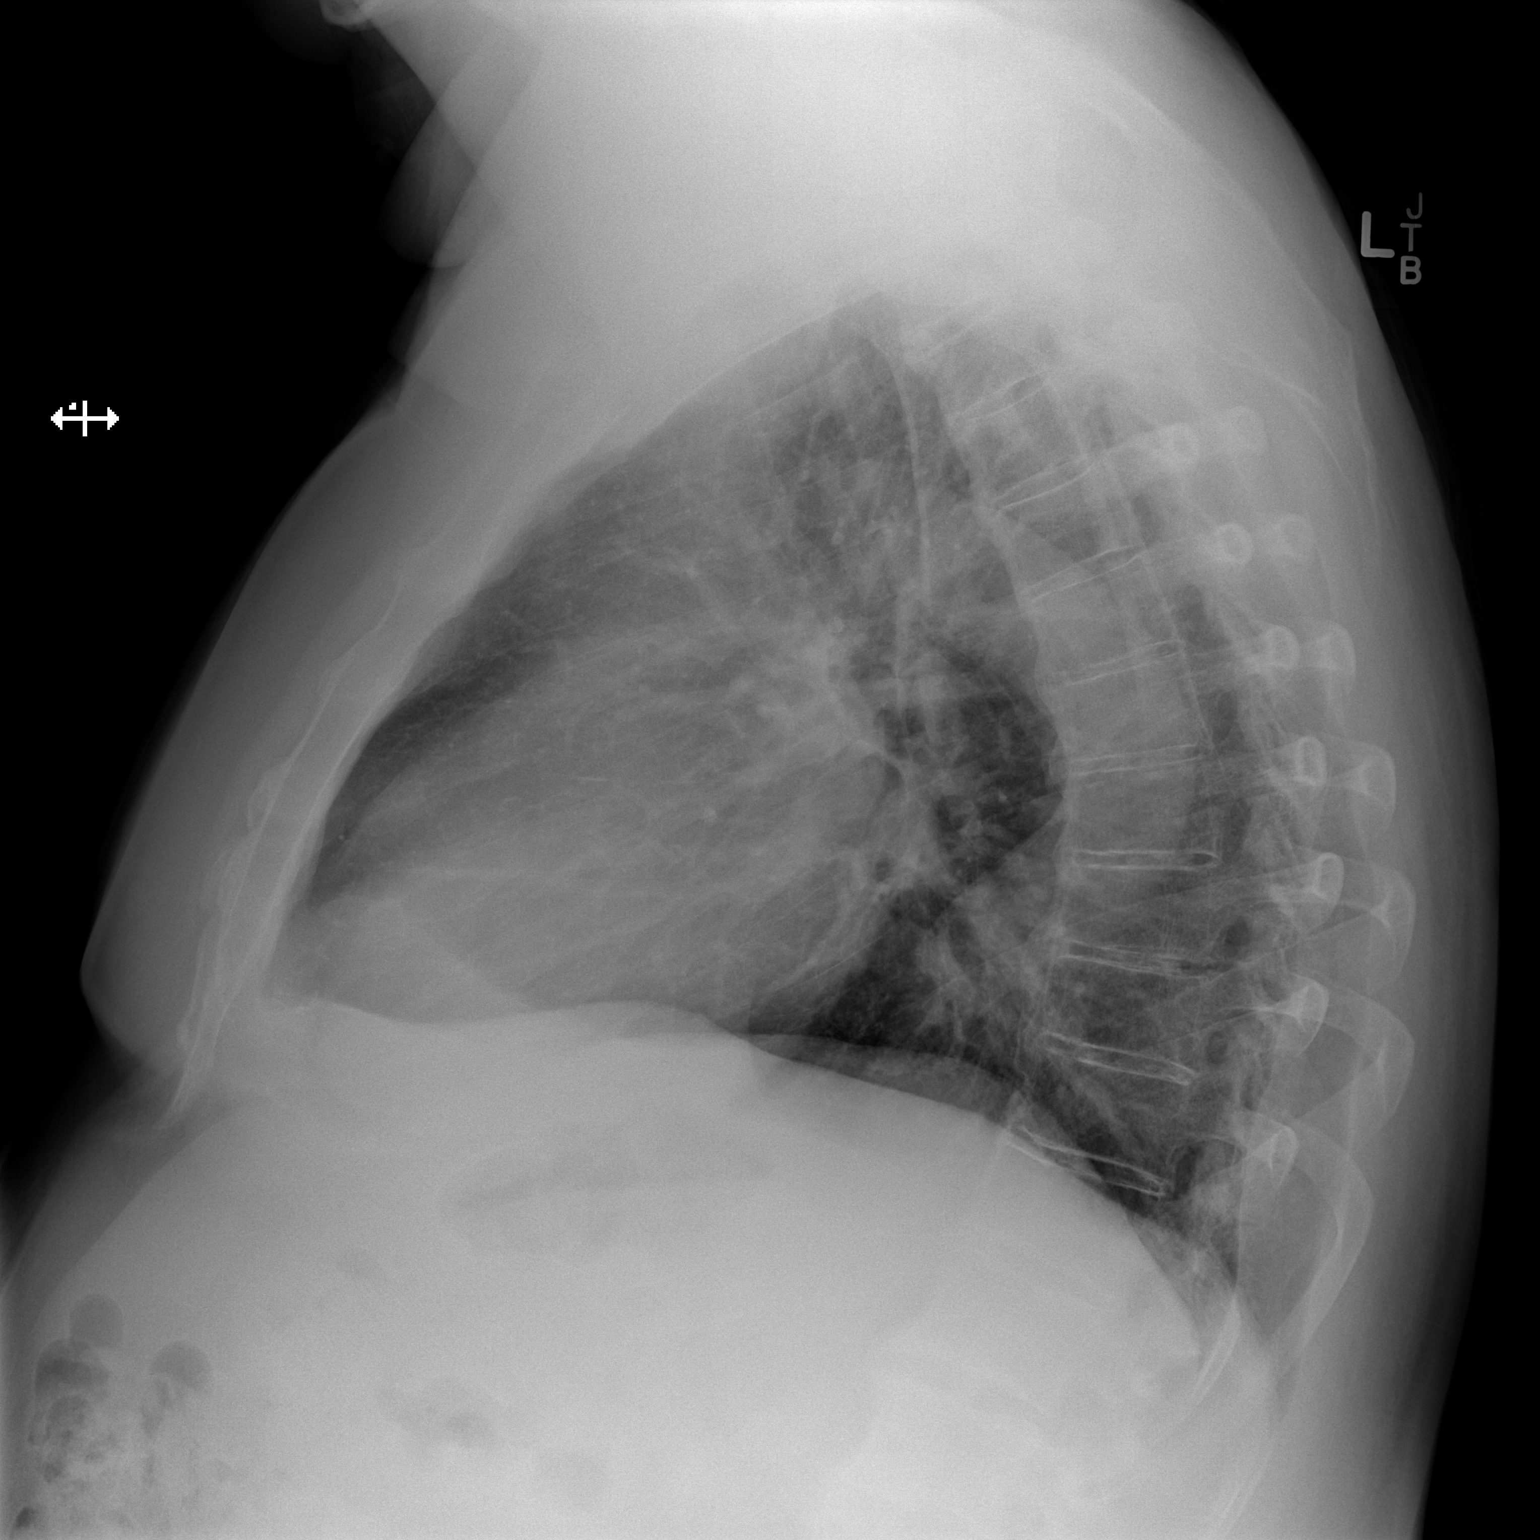

[2 of 2 positions shown; findings below may reference images not displayed]

FINDINGS: Cardiac silhouette projects within normal limits in size and
contour.

Pronounced left lateral margin of the descending thoracic aorta with
double density overlying the left heart.

No evidence of pulmonary vascular congestion.

No confluent airspace disease, pneumothorax, pleural effusion.

Pleural parenchymal thickening.

No displaced fracture.

Unremarkable appearance of the upper abdomen.

Flowing osteophyte production of the thoracic spine.
IMPRESSION: No radiographic evidence of acute cardiopulmonary disease.

Tortuosity of the thoracic aorta compatible with longstanding
hypertension.

## 2016-07-29 DIAGNOSIS — R269 Unspecified abnormalities of gait and mobility: Secondary | ICD-10-CM | POA: Insufficient documentation

## 2016-12-30 DIAGNOSIS — M4316 Spondylolisthesis, lumbar region: Secondary | ICD-10-CM | POA: Insufficient documentation

## 2017-06-20 DIAGNOSIS — R918 Other nonspecific abnormal finding of lung field: Secondary | ICD-10-CM | POA: Diagnosis not present

## 2017-06-20 DIAGNOSIS — R531 Weakness: Secondary | ICD-10-CM | POA: Diagnosis not present

## 2017-06-20 DIAGNOSIS — G459 Transient cerebral ischemic attack, unspecified: Secondary | ICD-10-CM | POA: Diagnosis not present

## 2017-06-20 DIAGNOSIS — I10 Essential (primary) hypertension: Secondary | ICD-10-CM | POA: Diagnosis not present

## 2017-06-20 DIAGNOSIS — R0602 Shortness of breath: Secondary | ICD-10-CM | POA: Diagnosis not present

## 2017-06-20 DIAGNOSIS — Z9114 Patient's other noncompliance with medication regimen: Secondary | ICD-10-CM | POA: Diagnosis not present

## 2017-06-20 DIAGNOSIS — R202 Paresthesia of skin: Secondary | ICD-10-CM | POA: Diagnosis not present

## 2017-06-20 DIAGNOSIS — R42 Dizziness and giddiness: Secondary | ICD-10-CM | POA: Diagnosis not present

## 2017-06-30 DIAGNOSIS — I10 Essential (primary) hypertension: Secondary | ICD-10-CM | POA: Diagnosis not present

## 2017-09-28 DIAGNOSIS — Z0000 Encounter for general adult medical examination without abnormal findings: Secondary | ICD-10-CM | POA: Diagnosis not present

## 2017-09-28 DIAGNOSIS — Z125 Encounter for screening for malignant neoplasm of prostate: Secondary | ICD-10-CM | POA: Diagnosis not present

## 2017-10-05 DIAGNOSIS — L570 Actinic keratosis: Secondary | ICD-10-CM | POA: Diagnosis not present

## 2017-10-05 DIAGNOSIS — Z23 Encounter for immunization: Secondary | ICD-10-CM | POA: Diagnosis not present

## 2017-10-05 DIAGNOSIS — I10 Essential (primary) hypertension: Secondary | ICD-10-CM | POA: Diagnosis not present

## 2017-10-05 DIAGNOSIS — H6120 Impacted cerumen, unspecified ear: Secondary | ICD-10-CM | POA: Diagnosis not present

## 2017-10-05 DIAGNOSIS — Z0000 Encounter for general adult medical examination without abnormal findings: Secondary | ICD-10-CM | POA: Diagnosis not present

## 2017-11-01 DIAGNOSIS — M8580 Other specified disorders of bone density and structure, unspecified site: Secondary | ICD-10-CM | POA: Diagnosis not present

## 2017-11-01 DIAGNOSIS — L918 Other hypertrophic disorders of the skin: Secondary | ICD-10-CM | POA: Diagnosis not present

## 2017-12-14 DIAGNOSIS — L03113 Cellulitis of right upper limb: Secondary | ICD-10-CM | POA: Diagnosis not present

## 2017-12-16 DIAGNOSIS — L03119 Cellulitis of unspecified part of limb: Secondary | ICD-10-CM | POA: Diagnosis not present

## 2017-12-16 DIAGNOSIS — M109 Gout, unspecified: Secondary | ICD-10-CM | POA: Diagnosis not present

## 2017-12-19 DIAGNOSIS — L03119 Cellulitis of unspecified part of limb: Secondary | ICD-10-CM | POA: Diagnosis not present

## 2017-12-19 DIAGNOSIS — M109 Gout, unspecified: Secondary | ICD-10-CM | POA: Diagnosis not present

## 2018-06-05 DIAGNOSIS — J019 Acute sinusitis, unspecified: Secondary | ICD-10-CM | POA: Diagnosis not present

## 2020-04-01 ENCOUNTER — Encounter: Payer: Self-pay | Admitting: Emergency Medicine

## 2020-04-01 ENCOUNTER — Emergency Department (INDEPENDENT_AMBULATORY_CARE_PROVIDER_SITE_OTHER)
Admission: EM | Admit: 2020-04-01 | Discharge: 2020-04-01 | Disposition: A | Discharge status: Home / Self Care | Payer: Medicare Other | Source: Home / Self Care

## 2020-04-01 ENCOUNTER — Other Ambulatory Visit: Payer: Self-pay

## 2020-04-01 DIAGNOSIS — N50812 Left testicular pain: Secondary | ICD-10-CM

## 2020-04-01 DIAGNOSIS — N390 Urinary tract infection, site not specified: Secondary | ICD-10-CM

## 2020-04-01 DIAGNOSIS — N401 Enlarged prostate with lower urinary tract symptoms: Secondary | ICD-10-CM

## 2020-04-01 LAB — POCT URINALYSIS DIP (MANUAL ENTRY)
Bilirubin, UA: NEGATIVE
Glucose, UA: NEGATIVE mg/dL
Nitrite, UA: NEGATIVE
Protein Ur, POC: 100 mg/dL — AB
Spec Grav, UA: >=1.03 — AB (ref 1.010–1.025)
Urobilinogen, UA: 0.2 E.U./dL
pH, UA: 5 (ref 5.0–8.0)

## 2020-04-01 MED ORDER — AMOXICILLIN-POT CLAVULANATE 875-125 MG PO TABS: 1.0000 | ORAL_TABLET | Freq: Two times a day (BID) | ORAL | 0 refills | Status: AC

## 2020-04-01 NOTE — Discharge Instructions (Addendum)
You are being treated for a urinary tract infection today. Follow-up with Dr. Annabell Howells or one of the providers over at [facility] urology as your enlarged prostate warrants further work-up and evaluation given that you are having symptoms which is resulting in a urinary tract infection.

## 2020-04-01 NOTE — ED Provider Notes (Signed)
[facility]    CSN: [id number] Arrival date & time: 04/01/20  1559      History   Chief Complaint Chief Complaint  Patient presents with  . Testicle Pain    left    HPI Justin Choi is a 70 y.o. male.   HPI  Patient in with over 1 week of left groin/testicle pain urinary frequency, dysuria, without any flank pain. Patient reports a known history of BPH. Seen urology over 3 years ago has not seen them since and was diagnosed with enlarged prostate and had an elevated PSA. Symptoms of BPH were non worrisome at that time, however as of recent he endorses hesitancy with urination, urine frequency, and occasional dysuria.  He denies a history of recurrent UTIs.  Symptoms of concern have been going on for a few weeks.  He has been afebrile.  At present he is only taking a prostate supplement for his BPH symptoms.   Past Medical History:  Diagnosis Date  . Gout   . Hypertension     Patient Active Problem List   Diagnosis Date Noted  . Spondylolisthesis of lumbar region 12/30/2016  . Gait disturbance 07/29/2016  . Impaired mobility and ADLs 05/18/2016  . Multiple trauma 04/28/2016  . Arthritis of knee 12/04/2013    Past Surgical History:  Procedure Laterality Date  . CLEFT PALATE REPAIR     as child  . 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 Authorizing Provider  bisoprolol (ZEBETA) 5 MG tablet Take 5 mg by mouth daily.    [provider]  docusate sodium 100 MG CAPS Take 100 mg by mouth 2 (two) times daily. 12/06/13   Cammy Copa, MD  methocarbamol (ROBAXIN) 500 MG tablet Take 1 tablet (500 mg total) by mouth every 6 (six) hours as needed for muscle spasms. 12/06/13   Cammy Copa, MD  Multiple  Vitamin (MULTIVITAMIN WITH MINERALS) TABS tablet Take 1 tablet by mouth daily.    [provider]  Omega-3 Fatty Acids (FISH OIL PO) Take 1 capsule by mouth daily.    [provider]  oxyCODONE (OXY IR/ROXICODONE) 5 MG immediate release tablet Take 1-2 tablets (5-10 mg total) by mouth every 3 (three) hours as needed for breakthrough pain. Patient not taking: Reported on 04/01/2020 12/06/13   Cammy Copa, MD  warfarin (COUMADIN) 5 MG tablet Take 1 tablet (5 mg total) by mouth daily. 12/06/13   Cammy Copa, MD    Family History No family history on file.  Social History Social History   Tobacco Use  . Smoking status: Never Smoker  Substance Use Topics  . Alcohol use: Yes    Comment: occ  . Drug use: No     Allergies   Gabapentin   Review of Systems Review of Systems Pertinent negatives listed in HPI Physical Exam Triage Vital Signs ED Triage Vitals  Enc Vitals Group     BP 04/01/20 1702 (!) 168/96     Pulse Rate 04/01/20 1702 60     Resp 04/01/20 1702 18     Temp 04/01/20 1702 98.6 F (37 C)     Temp Source 04/01/20 1702 Oral     SpO2 04/01/20 1702 98 %  Weight 04/01/20 1708 263 lb (119.3 kg)     Height 04/01/20 1708 5\' 10"  (1.778 m)     Head Circumference --      Peak Flow --      Pain Score 04/01/20 1707 3     Pain Loc --      Pain Edu? --      Excl. in GC? --    No data found.  Updated Vital Signs BP (!) 168/96 (BP Location: Right Arm)   Pulse 60   Temp 98.6 F (37 C) (Oral)   Resp 18   Ht 5\' 10"  (1.778 m)   Wt 263 lb (119.3 kg)   SpO2 98%   BMI 37.74 kg/m   Visual Acuity Right Eye Distance:   Left Eye Distance:   Bilateral Distance:    Right Eye Near:   Left Eye Near:    Bilateral Near:     Physical Exam General appearance: alert, obese, chronically ill-appearing,  cooperative and no distress Head: Normocephalic, without obvious abnormality, atraumatic Respiratory: Respirations even and unlabored, normal  respiratory rate Heart: Rate and rhythm normal. No gallop or murmurs noted on exam  Abdomen: BS +, no distention, no rebound tenderness, or no mass Extremities: No gross deformities Skin: Skin color, texture, turgor normal. No rashes seen  Psych: Appropriate mood and affect. Neurologic: Mental status: Alert, oriented to person, place, and time, thought content appropriate.   UC Treatments / Results  Labs (all labs ordered are listed, but only abnormal results are displayed) Labs Reviewed  POCT URINALYSIS DIP (MANUAL ENTRY) - Abnormal; Notable for the following components:      Result Value   Clarity, UA turbid (*)    Ketones, POC UA trace (5) (*)    Spec Grav, UA >=1.030 (*)    Blood, UA moderate (*)    Protein Ur, POC =100 (*)    Leukocytes, UA Large (3+) (*)    All other components within normal limits  URINE CULTURE    EKG   Radiology No results found.  Procedures Procedures (including critical care time)  Medications Ordered in UC Medications - No data to display  Initial Impression / Assessment and Plan / UC Course  I have reviewed the triage vital signs and the nursing notes.  Pertinent labs & imaging results that were available during my care of the patient were reviewed by me and considered in my medical decision making (see chart for details).    UA significant for urinary tract infection.  Urinary tract infection is complicated as it is an older male with a history of BPH.  Given history unable to determine length of time patient has had active urinary tract symptoms.  He is afebrile and negative CVA tenderness or back pain therefore low suspicion for possible pyelonephritis. Initially intended to put placed on Cipro for treatment of acute urinary tract infection however patient's medication list indicated currently taking Coumadin after further discussing with patient he is not taking the Coumadin this was from an old hospitalization.  Had already prescribed the  Augmentin we will go ahead and trial Augmentin to cover urinary tract infection while culture is pending.  If culture indicates any additional treatment warranted we will change at that time.  Patient agreed with plan and will follow-up with [facility] on tomorrow to schedule follow-up appointment for further evaluation of prostate enlargement symptoms.  Final Clinical Impressions(s) / UC Diagnoses   Final diagnoses:  Testicular pain, left  Complicated UTI (urinary  tract infection)  Benign prostatic hyperplasia with urinary frequency     Discharge Instructions     You are being treated for a urinary tract infection today. Follow-up with Dr. Annabell Howells or one of the providers over at [facility] urology as your enlarged prostate warrants further work-up and evaluation given that you are having symptoms which is resulting in a urinary tract infection.    ED Prescriptions    Medication Sig Dispense Auth. Provider   amoxicillin-clavulanate (AUGMENTIN) 875-125 MG tablet Take 1 tablet by mouth 2 (two) times daily for 10 days. 20 tablet Bing Neighbors, FNP     PDMP not reviewed this encounter.   Bing Neighbors, FNP 04/02/20 [id number]

## 2020-04-01 NOTE — ED Triage Notes (Addendum)
Left testicular pain x 1 week Pain has decreased  Denies urination problems  No known injury Pt does remember picking up something heavy last Monday  Concerned about a hernia COVID vaccine -[employer] in April 2021 - no booster

## 2020-04-03 LAB — URINE CULTURE
MICRO NUMBER:: 11430863
SPECIMEN QUALITY:: ADEQUATE

## 2020-08-15 ENCOUNTER — Emergency Department
Admission: EM | Admit: 2020-08-15 | Discharge: 2020-08-15 | Disposition: A | Discharge status: Home / Self Care | Payer: Medicare Other | Source: Home / Self Care

## 2020-08-15 ENCOUNTER — Other Ambulatory Visit: Payer: Self-pay

## 2020-08-15 DIAGNOSIS — N3090 Cystitis, unspecified without hematuria: Secondary | ICD-10-CM | POA: Diagnosis not present

## 2020-08-15 LAB — POCT URINALYSIS DIP (MANUAL ENTRY)
Bilirubin, UA: NEGATIVE
Glucose, UA: NEGATIVE mg/dL
Ketones, POC UA: NEGATIVE mg/dL
Nitrite, UA: POSITIVE — AB
Protein Ur, POC: 30 mg/dL — AB
Spec Grav, UA: >=1.03 — AB (ref 1.010–1.025)
Urobilinogen, UA: 0.2 E.U./dL
pH, UA: 5.5 (ref 5.0–8.0)

## 2020-08-15 MED ORDER — SULFAMETHOXAZOLE-TRIMETHOPRIM 800-160 MG PO TABS: 1.0000 | ORAL_TABLET | Freq: Two times a day (BID) | ORAL | 0 refills | Status: DC

## 2020-08-15 NOTE — ED Triage Notes (Signed)
Pt c/o urinary frequency and dysuria x 1 week. Hx of frequent UTIs according to pt. Denies back pain. Pt hypertensive today, does take BP meds in am.

## 2020-08-15 NOTE — ED Provider Notes (Signed)
[facility]    CSN: [id number] Arrival date & time: 08/15/20  1200      History   Chief Complaint Chief Complaint  Patient presents with  . Dysuria  . Urinary Frequency    HPI Justin Choi is a 70 y.o. male who presents with onset of dysuria and freaquency x 1 week. Has hx of frequent UTI's. Last one Jan 2022  Denies flank pain.     Past Medical History:  Diagnosis Date  . Gout   . Hypertension     Patient Active Problem List   Diagnosis Date Noted  . Spondylolisthesis of lumbar region 12/30/2016  . Gait disturbance 07/29/2016  . Impaired mobility and ADLs 05/18/2016  . Multiple trauma 04/28/2016  . Arthritis of knee 12/04/2013    Past Surgical History:  Procedure Laterality Date  . CLEFT PALATE REPAIR     as child  . 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 Authorizing Provider  sulfamethoxazole-trimethoprim (BACTRIM DS) 800-160 MG tablet Take 1 tablet by mouth 2 (two) times daily for 7 days. 08/15/20 08/22/20 Yes Rodriguez-Southworth, Nettie Elm, PA-C  bisoprolol (ZEBETA) 5 MG tablet Take 5 mg by mouth daily.    [provider]  docusate sodium 100 MG CAPS Take 100 mg by mouth 2 (two) times daily. 12/06/13   Cammy Copa, MD  methocarbamol (ROBAXIN) 500 MG tablet Take 1 tablet (500 mg total) by mouth every 6 (six) hours as needed for muscle spasms. 12/06/13   Cammy Copa, MD  Multiple Vitamin (MULTIVITAMIN WITH MINERALS) TABS tablet Take 1 tablet by mouth daily.    [provider]  Omega-3 Fatty Acids (FISH OIL PO) Take 1 capsule by mouth daily.    [provider]  oxyCODONE (OXY IR/ROXICODONE) 5 MG immediate release tablet Take 1-2 tablets (5-10 mg total) by mouth every 3  (three) hours as needed for breakthrough pain. Patient not taking: Reported on 04/01/2020 12/06/13   Cammy Copa, MD  warfarin (COUMADIN) 5 MG tablet Take 1 tablet (5 mg total) by mouth daily. 12/06/13 04/01/20  Cammy Copa, MD    Family History Family History  Family history unknown: Yes    Social History Social History   Tobacco Use  . Smoking status: Never Smoker  . Smokeless tobacco: Never Used  Substance Use Topics  . Alcohol use: Yes    Comment: occ  . Drug use: No     Allergies   Gabapentin   Review of Systems Review of Systems  Constitutional: Negative for chills, diaphoresis and fever.  Cardiovascular:       Hx of HTN and takes his meds qhs  Gastrointestinal: Negative for abdominal pain.  Genitourinary: Positive for dysuria, frequency and urgency. Negative for decreased urine volume, difficulty urinating and flank pain.  Musculoskeletal: Negative for myalgias.  Skin: Negative for rash.     Physical Exam Triage Vital Signs ED Triage Vitals  Enc Vitals Group     BP 08/15/20 1212 (!) 175/94     Pulse Rate 08/15/20 1212 83     Resp 08/15/20 1212 17     Temp 08/15/20 1212 99.5 F (37.5 C)     Temp Source 08/15/20 1212 Oral  SpO2 08/15/20 1212 96 %     Weight --      Height --      Head Circumference --      Peak Flow --      Pain Score 08/15/20 1213 0     Pain Loc --      Pain Edu? --      Excl. in GC? --    No data found.  Updated Vital Signs BP (!) 175/94 (BP Location: Right Arm)   Pulse 83   Temp 99.5 F (37.5 C) (Oral)   Resp 17   SpO2 96%   Visual Acuity Right Eye Distance:   Left Eye Distance:   Bilateral Distance:    Right Eye Near:   Left Eye Near:    Bilateral Near:     Physical Exam Physical Exam Vitals and nursing note reviewed.  Constitutional:      General: She is not in acute distress.    Appearance: She is not toxic-appearing.  HENT:     Head: Normocephalic.     Right Ear: External ear normal.      Left Ear: External ear normal.  Eyes:     General: No scleral icterus.    Conjunctiva/sclera: Conjunctivae normal.  Pulmonary:     Effort: Pulmonary effort is normal.  Abdominal:     General: Bowel sounds are normal.     Palpations: Abdomen is soft. There is no mass.     Tenderness: There is no guarding or rebound.     Comments: - CVA tenderness   Musculoskeletal:        General: Normal range of motion.     Cervical back: Neck supple.     = Skin:    General: Skin is warm and dry.     Findings: No rash.  Neurological:     Mental Status: She is alert and oriented to person, place, and time.     Gait: Gait normal.  Psychiatric:        Mood and Affect: Mood normal.        Behavior: Behavior normal.        Thought Content: Thought content normal.        Judgment: Judgment normal.     UC Treatments / Results  Labs (all labs ordered are listed, but only abnormal results are displayed) Labs Reviewed  POCT URINALYSIS DIP (MANUAL ENTRY) - Abnormal; Notable for the following components:      Result Value   Clarity, UA cloudy (*)    Spec Grav, UA >=1.030 (*)    Blood, UA moderate (*)    Protein Ur, POC =30 (*)    Nitrite, UA Positive (*)    Leukocytes, UA Large (3+) (*)    All other components within normal limits  URINE CULTURE    EKG   Radiology No results found.  Procedures Procedures (including critical care time)  Medications Ordered in UC Medications - No data to display  Initial Impression / Assessment and Plan / UC Course  I have reviewed the triage vital signs and the nursing notes. Pertinent labs  results that were available during my care of the patient were reviewed by me and considered in my medical decision making (see chart for details). I placed him on Bactrim DS as noted. Urine culture was sent out and we will call him if we need to change the medication.  Final Clinical Impressions(s) / UC Diagnoses   Final diagnoses:  Cystitis  Discharge  Instructions   None    ED Prescriptions    Medication Sig Dispense Auth. Provider   sulfamethoxazole-trimethoprim (BACTRIM DS) 800-160 MG tablet Take 1 tablet by mouth 2 (two) times daily for 7 days. 14 tablet Rodriguez-Southworth, Nettie Elm, PA-C     PDMP not reviewed this encounter.   Garey Ham, PA-C 08/15/20 1351

## 2020-08-17 LAB — URINE CULTURE
MICRO NUMBER:: 11968938
SPECIMEN QUALITY:: ADEQUATE

## 2020-08-18 ENCOUNTER — Emergency Department
Admission: EM | Admit: 2020-08-18 | Discharge: 2020-08-18 | Disposition: A | Discharge status: Home / Self Care | Payer: Medicare Other | Source: Home / Self Care

## 2020-08-18 ENCOUNTER — Other Ambulatory Visit: Payer: Self-pay

## 2020-08-18 DIAGNOSIS — N5089 Other specified disorders of the male genital organs: Secondary | ICD-10-CM

## 2020-08-18 DIAGNOSIS — N4889 Other specified disorders of penis: Secondary | ICD-10-CM

## 2020-08-18 MED ORDER — METHYLPREDNISOLONE ACETATE 80 MG/ML IJ SUSP
80.0000 mg | Freq: Once | INTRAMUSCULAR | Status: AC
  Administered 2020-08-18: 80 mg via INTRAMUSCULAR

## 2020-08-18 MED ORDER — PREDNISONE 20 MG PO TABS: ORAL_TABLET | ORAL | 0 refills | Status: DC

## 2020-08-18 MED ORDER — AMOXICILLIN-POT CLAVULANATE 875-125 MG PO TABS: 1.0000 | ORAL_TABLET | Freq: Two times a day (BID) | ORAL | 0 refills | Status: AC

## 2020-08-18 NOTE — Discharge Instructions (Addendum)
Advised/instructed patient to discontinue Bactrim now.  Start Augmentin now.  Advised patient to take medication as directed with food to completion.  Instructed patient if penile/scrotum swelling continues may start oral prednisone burst tomorrow morning, Tuesday, 08/19/2020.  Advised patient if swelling worsens over the next 24 to 48 hours please go to nearest ED for immediate evaluation.

## 2020-08-18 NOTE — ED Triage Notes (Signed)
Pt was seen on Friday for UTI and was prescribed sulfameth/trimethoprim. Pt st he has experienced genital swelling since starting the medication. Pt st the the urinary frequency has gotten slightly better but has had an increase in swelling.

## 2020-08-18 NOTE — ED Provider Notes (Addendum)
[facility]    CSN: [id number] Arrival date & time: 08/18/20  1100      History   Chief Complaint Chief Complaint  Patient presents with  . Groin Swelling    HPI Justin Choi is a 70 y.o. male.   HPI 70 year old male presents with genital swelling.  Patient reports was evaluated for UTI on Friday of last week and prescribed Bactrim.  Patient reports genital swelling since beginning Bactrim and is currently still taking this medication.  Patient reports taking 7 of 14 Bactrim, including 1 dose this morning.  Patient reported immediate genital swelling (shaft of penis, and scrotum bilaterally) after taking 1st Bactrim he continued and stopped at 7 total this morning.  Patient denies difficulty urinating, denies dysuria, denies hematuria.  Reports he is currently followed by Urology twice annually.  Past Medical History:  Diagnosis Date  . Gout   . Hypertension     Patient Active Problem List   Diagnosis Date Noted  . Spondylolisthesis of lumbar region 12/30/2016  . Gait disturbance 07/29/2016  . Impaired mobility and ADLs 05/18/2016  . Multiple trauma 04/28/2016  . Arthritis of knee 12/04/2013    Past Surgical History:  Procedure Laterality Date  . CLEFT PALATE REPAIR     as child  . 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 Authorizing Provider  amoxicillin-clavulanate (AUGMENTIN) 875-125 MG tablet Take 1 tablet by mouth every 12 (twelve) hours for 5 days. 08/18/20 08/23/20 Yes Trevor Iha, FNP  predniSONE (DELTASONE) 20 MG tablet Take 3 tabs PO daily x 5 days. 08/18/20  Yes Trevor Iha, FNP  bisoprolol (ZEBETA) 5 MG tablet Take 5 mg by mouth daily.    [provider]  docusate sodium 100 MG CAPS Take 100 mg  by mouth 2 (two) times daily. 12/06/13   Cammy Copa, MD  methocarbamol (ROBAXIN) 500 MG tablet Take 1 tablet (500 mg total) by mouth every 6 (six) hours as needed for muscle spasms. 12/06/13   Cammy Copa, MD  Multiple Vitamin (MULTIVITAMIN WITH MINERALS) TABS tablet Take 1 tablet by mouth daily.    [provider]  Omega-3 Fatty Acids (FISH OIL PO) Take 1 capsule by mouth daily.    [provider]  oxyCODONE (OXY IR/ROXICODONE) 5 MG immediate release tablet Take 1-2 tablets (5-10 mg total) by mouth every 3 (three) hours as needed for breakthrough pain. Patient not taking: No sig reported 12/06/13   Cammy Copa, MD  warfarin (COUMADIN) 5 MG tablet Take 1 tablet (5 mg total) by mouth daily. 12/06/13 04/01/20  Cammy Copa, MD    Family History Family History  Problem Relation Age of Onset  . Emphysema Mother   . Cancer Father     Social History Social History   Tobacco Use  . Smoking status: Never Smoker  . Smokeless tobacco: Never Used  Substance Use Topics  . Alcohol use: Yes    Alcohol/week: 1.0 standard drink    Types: 1 Shots of liquor per week    Comment: once a night   . Drug use: No     Allergies   Gabapentin   Review of Systems Review of Systems  Constitutional: Negative.   HENT: Negative.  Eyes: Negative.   Respiratory: Negative.   Gastrointestinal: Negative.   Genitourinary: Positive for penile swelling and scrotal swelling. Negative for penile pain and testicular pain.  Musculoskeletal: Negative.   Skin: Negative.   Neurological: Negative.      Physical Exam Triage Vital Signs ED Triage Vitals  Enc Vitals Group     BP 08/18/20 1138 (!) 159/92     Pulse Rate 08/18/20 1138 63     Resp 08/18/20 1138 16     Temp 08/18/20 1138 98.1 F (36.7 C)     Temp Source 08/18/20 1138 Oral     SpO2 08/18/20 1138 97 %     Weight 08/18/20 1134 260 lb (117.9 kg)     Height 08/18/20 1134 5\' 10"  (1.778 m)     Head  Circumference --      Peak Flow --      Pain Score 08/18/20 1133 2     Pain Loc --      Pain Edu? --      Excl. in GC? --    No data found.  Updated Vital Signs BP (!) 159/92 (BP Location: Right Arm)   Pulse 63   Temp 98.1 F (36.7 C) (Oral)   Resp 16   Ht 5\' 10"  (1.778 m)   Wt 260 lb (117.9 kg)   SpO2 97%   BMI 37.31 kg/m      Physical Exam Constitutional:      General: He is not in acute distress.    Appearance: Normal appearance. He is obese. He is not ill-appearing.  HENT:     Head: Normocephalic and atraumatic.     Mouth/Throat:     Mouth: Mucous membranes are moist.     Pharynx: Oropharynx is clear.  Eyes:     Extraocular Movements: Extraocular movements intact.     Conjunctiva/sclera: Conjunctivae normal.     Pupils: Pupils are equal, round, and reactive to light.  Cardiovascular:     Rate and Rhythm: Normal rate and regular rhythm.     Pulses: Normal pulses.     Heart sounds: Normal heart sounds.  Pulmonary:     Effort: Pulmonary effort is normal.     Breath sounds: Normal breath sounds.     Comments: No adventitious breath sounds noted Genitourinary:    Comments: Mild to moderate swelling of shaft of penis, foreskin, scrotum bilaterally, nontender to palpation, nonrestrictive nonconstrictive per patient Musculoskeletal:        General: Normal range of motion.     Cervical back: Normal range of motion and neck supple. No tenderness.  Lymphadenopathy:     Cervical: Cervical adenopathy present.  Skin:    General: Skin is warm and dry.  Neurological:     General: No focal deficit present.     Mental Status: He is alert and oriented to person, place, and time.  Psychiatric:        Mood and Affect: Mood normal.        Behavior: Behavior normal.      UC Treatments / Results  Labs (all labs ordered are listed, but only abnormal results are displayed) Labs Reviewed - No data to display  EKG   Radiology No results found.  Procedures Procedures  (including critical care time)  Medications Ordered in UC Medications  methylPREDNISolone acetate (DEPO-MEDROL) injection 80 mg (80 mg Intramuscular Given 08/18/20 1248)    Initial Impression / Assessment and Plan / UC Course  I have reviewed the triage vital  signs and the nursing notes.  Pertinent labs & imaging results that were available during my care of the patient were reviewed by me and considered in my medical decision making (see chart for details).     MDM: 1.  Penile swelling, 2.  Scrotal swelling.  Patient discharged home, hemodynamically stable. Final Clinical Impressions(s) / UC Diagnoses   Final diagnoses:  Penile swelling  Swelling of scrotum     Discharge Instructions     Advised/instructed patient to discontinue Bactrim now.  Start Augmentin now.  Advised patient to take medication as directed with food to completion.  Instructed patient if penile/scrotum swelling continues may start oral prednisone burst tomorrow morning, Tuesday, 08/19/2020.  Advised patient if swelling worsens over the next 24 to 48 hours please go to nearest ED for immediate evaluation.    ED Prescriptions    Medication Sig Dispense Auth. Provider   amoxicillin-clavulanate (AUGMENTIN) 875-125 MG tablet Take 1 tablet by mouth every 12 (twelve) hours for 5 days. 10 tablet Trevor Iha, FNP   predniSONE (DELTASONE) 20 MG tablet Take 3 tabs PO daily x 5 days. 15 tablet Trevor Iha, FNP     PDMP not reviewed this encounter.   Trevor Iha, FNP 08/18/20 1255    Trevor Iha, FNP 08/18/20 1256

## 2020-09-29 ENCOUNTER — Other Ambulatory Visit: Payer: Self-pay

## 2020-09-29 ENCOUNTER — Emergency Department (INDEPENDENT_AMBULATORY_CARE_PROVIDER_SITE_OTHER)
Admission: EM | Admit: 2020-09-29 | Discharge: 2020-09-29 | Disposition: A | Discharge status: Home / Self Care | Payer: Medicare Other | Source: Home / Self Care | Attending: Family Medicine | Admitting: Family Medicine

## 2020-09-29 DIAGNOSIS — N390 Urinary tract infection, site not specified: Secondary | ICD-10-CM

## 2020-09-29 DIAGNOSIS — N401 Enlarged prostate with lower urinary tract symptoms: Secondary | ICD-10-CM | POA: Diagnosis not present

## 2020-09-29 DIAGNOSIS — N138 Other obstructive and reflux uropathy: Secondary | ICD-10-CM

## 2020-09-29 DIAGNOSIS — R300 Dysuria: Secondary | ICD-10-CM

## 2020-09-29 LAB — POCT URINALYSIS DIP (MANUAL ENTRY)
Bilirubin, UA: NEGATIVE
Glucose, UA: NEGATIVE mg/dL
Ketones, POC UA: NEGATIVE mg/dL
Nitrite, UA: POSITIVE — AB
Protein Ur, POC: 100 mg/dL — AB
Spec Grav, UA: >=1.03 — AB (ref 1.010–1.025)
Urobilinogen, UA: 0.2 E.U./dL
pH, UA: 6 (ref 5.0–8.0)

## 2020-09-29 MED ORDER — AMOXICILLIN-POT CLAVULANATE 875-125 MG PO TABS: 1.0000 | ORAL_TABLET | Freq: Two times a day (BID) | ORAL | 0 refills | Status: DC

## 2020-09-29 NOTE — ED Triage Notes (Signed)
[facility] put him on augmentin and he would like that if possible.

## 2020-09-29 NOTE — ED Triage Notes (Signed)
Patient here with dysuria; had UTI last month along with testiculitis and saw urologist who place him on antibiotics and it cleared; now uncomfortable again. Does have enlarged prostate. Has had covid vaccinations.

## 2020-09-29 NOTE — ED Provider Notes (Signed)
[facility]    CSN: [id number] Arrival date & time: 09/29/20  1805      History   Chief Complaint Chief Complaint  Patient presents with   Dysuria    HPI Justin Choi is a 70 y.o. male.   HPI  Patient has known BPH.  He sees urology.  His only prostate medication at this time is a natural supplement called Prost-X.  He does not know what this includes.  He has recurring infections.  He gets up to urinate at night sometimes every 30 minutes to an hour.  Currently has dysuria, frequency.  He states he does not like water.  He mostly drinks Coke.  I told him that this was not a good choice from his health standpoint.  Told him his Coke is a diuretic and is not a good fluid replacement.  I told him Coke is empty calories and contributes to his obesity.  I told him that on his urine test today his specific gravity was 1030 which indicates dehydration.  This is not good for urinary tract infections and urinary tract conditions.  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 Authorizing Provider  amoxicillin-clavulanate (AUGMENTIN) 875-125 MG tablet Take 1 tablet by mouth every 12 (twelve) hours. 09/29/20  Yes Eustace Moore, MD  bisoprolol (ZEBETA) 5 MG tablet Take 5 mg by mouth daily.    [provider]  docusate sodium 100 MG CAPS  Take 100 mg by mouth 2 (two) times daily. 12/06/13   Cammy Copa, MD  Multiple Vitamin (MULTIVITAMIN WITH MINERALS) TABS tablet Take 1 tablet by mouth daily.    [provider]  Omega-3 Fatty Acids (FISH OIL PO) Take 1 capsule by mouth daily.    [provider]  warfarin (COUMADIN) 5 MG tablet Take 1 tablet (5 mg total) by mouth daily. 12/06/13 04/01/20  Cammy Copa, MD    Family History Family History  Problem Relation Age of Onset   Emphysema Mother    Cancer Father     Social History Social History   Tobacco Use   Smoking status: Never   Smokeless tobacco: Never  Substance Use Topics   Alcohol use: Yes    Alcohol/week: 1.0 standard drink    Types: 1 Shots of liquor per week    Comment: once a night    Drug use: No     Allergies   Gabapentin   Review of Systems Review of Systems See HPI  Physical Exam Triage Vital Signs ED Triage Vitals  Enc Vitals Group     BP 09/29/20 1903 (!) 153/83     Pulse Rate 09/29/20 1903 (!) 59     Resp 09/29/20 1903 16     Temp 09/29/20 1903 98.4 F (  36.9 C)     Temp Source 09/29/20 1903 Oral     SpO2 09/29/20 1903 98 %     Weight 09/29/20 1908 257 lb 15 oz (117 kg)     Height 09/29/20 1908 5\' 10"  (1.778 m)     Head Circumference --      Peak Flow --      Pain Score 09/29/20 1908 1     Pain Loc --      Pain Edu? --      Excl. in GC? --    No data found.  Updated Vital Signs BP (!) 153/83 (BP Location: Right Arm)   Pulse (!) 59   Temp 98.4 F (36.9 C) (Oral)   Resp 16   Ht 5\' 10"  (1.778 m)   Wt 117 kg   SpO2 98%   BMI 37.01 kg/m      Physical Exam Constitutional:      General: He is not in acute distress.    Appearance: He is well-developed. He is obese.     Comments: Ambulates with a cane  HENT:     Head: Normocephalic and atraumatic.     Mouth/Throat:     Comments: Is wearing mask Eyes:     Conjunctiva/sclera: Conjunctivae normal.     Pupils: Pupils are equal, round, and  reactive to light.  Cardiovascular:     Rate and Rhythm: Normal rate.  Pulmonary:     Effort: Pulmonary effort is normal. No respiratory distress.  Abdominal:     General: There is distension.     Palpations: Abdomen is soft.     Tenderness: There is no right CVA tenderness or left CVA tenderness.     Comments: Protuberant  Musculoskeletal:        General: Normal range of motion.     Cervical back: Normal range of motion.  Skin:    General: Skin is warm and dry.  Neurological:     Mental Status: He is alert.     Gait: Gait abnormal.  Psychiatric:        Mood and Affect: Mood normal.     UC Treatments / Results  Labs (all labs ordered are listed, but only abnormal results are displayed) Labs Reviewed  POCT URINALYSIS DIP (MANUAL ENTRY) - Abnormal; Notable for the following components:      Result Value   Color, UA other (*)    Clarity, UA turbid (*)    Spec Grav, UA >=1.030 (*)    Blood, UA moderate (*)    Protein Ur, POC =100 (*)    Nitrite, UA Positive (*)    Leukocytes, UA Trace (*)    All other components within normal limits  URINE CULTURE    EKG   Radiology No results found.  Procedures Procedures (including critical care time)  Medications Ordered in UC Medications - No data to display  Initial Impression / Assessment and Plan / UC Course  I have reviewed the triage vital signs and the nursing notes.  Pertinent labs & imaging results that were available during my care of the patient were reviewed by me and considered in my medical decision making (see chart for details).     Urine is cultured to make sure Augmentin is a good choice.  I told him if he is gotten Augmentin for his last couple of infections, and he still gets frequent infections it might be time to change him to another antibiotic.  He is reluctant to change  when he feels like he has had good results.  I did talk to him about going on a medication to shrink his prostate to allow him to  sleep better.  He needs to address this with his PCP or with his urologist. Final Clinical Impressions(s) / UC Diagnoses   Final diagnoses:  Dysuria  Lower urinary tract infectious disease  BPH with obstruction/lower urinary tract symptoms     Discharge Instructions      Take Augmentin as directed Try to increase your water intake Talk to your family doctor or your urologist about medicine to shrink the prostate, I think this will help you sleep at night  Read information about prostate enlargement   ED Prescriptions     Medication Sig Dispense Auth. Provider   amoxicillin-clavulanate (AUGMENTIN) 875-125 MG tablet Take 1 tablet by mouth every 12 (twelve) hours. 14 tablet Eustace Moore, MD      PDMP not reviewed this encounter.   Eustace Moore, MD 09/29/20 2055

## 2020-09-29 NOTE — Discharge Instructions (Addendum)
Take Augmentin as directed Try to increase your water intake Talk to your family doctor or your urologist about medicine to shrink the prostate, I think this will help you sleep at night  Read information about prostate enlargement

## 2020-10-02 LAB — URINE CULTURE
MICRO NUMBER:: 12134407
SPECIMEN QUALITY:: ADEQUATE

## 2020-10-29 ENCOUNTER — Other Ambulatory Visit: Payer: Self-pay | Admitting: Internal Medicine

## 2020-10-29 DIAGNOSIS — E7800 Pure hypercholesterolemia, unspecified: Secondary | ICD-10-CM

## 2020-12-13 ENCOUNTER — Encounter: Payer: Self-pay | Admitting: Emergency Medicine

## 2020-12-13 ENCOUNTER — Other Ambulatory Visit: Payer: Self-pay

## 2020-12-13 ENCOUNTER — Emergency Department
Admission: EM | Admit: 2020-12-13 | Discharge: 2020-12-13 | Disposition: A | Discharge status: Home / Self Care | Payer: Medicare Other | Source: Home / Self Care | Attending: Family Medicine | Admitting: Family Medicine

## 2020-12-13 DIAGNOSIS — N3001 Acute cystitis with hematuria: Secondary | ICD-10-CM

## 2020-12-13 DIAGNOSIS — M5441 Lumbago with sciatica, right side: Secondary | ICD-10-CM

## 2020-12-13 HISTORY — DX: Urinary tract infection, site not specified: N39.0

## 2020-12-13 LAB — POCT URINALYSIS DIP (MANUAL ENTRY)
Bilirubin, UA: NEGATIVE
Glucose, UA: NEGATIVE mg/dL
Ketones, POC UA: NEGATIVE mg/dL
Nitrite, UA: POSITIVE — AB
Protein Ur, POC: 100 mg/dL — AB
Spec Grav, UA: >=1.03 — AB (ref 1.010–1.025)
Urobilinogen, UA: 0.2 E.U./dL
pH, UA: 6.5 (ref 5.0–8.0)

## 2020-12-13 MED ORDER — METHYLPREDNISOLONE 4 MG PO TBPK: ORAL_TABLET | ORAL | 0 refills | Status: DC

## 2020-12-13 MED ORDER — AMOXICILLIN-POT CLAVULANATE 875-125 MG PO TABS: 1.0000 | ORAL_TABLET | Freq: Two times a day (BID) | ORAL | 0 refills | Status: DC

## 2020-12-13 NOTE — Discharge Instructions (Addendum)
Take the antibiotic 2 times a day for 1 week Drink lots of water Take the Medrol Dosepak as directed for your back Take all of day 1 today Follow-up with the urologist as scheduled

## 2020-12-13 NOTE — ED Triage Notes (Signed)
Dysuria & frequency  x  1 week Frequent UTI  Pt has an appointment w/ urologist in November Also c/o sciatic nerve & would like steroids  Pt feels like he can't empty his bladder due to enlarged  prostate

## 2020-12-13 NOTE — ED Provider Notes (Signed)
[facility]    CSN: [id number] Arrival date & time: 12/13/20  1106      History   Chief Complaint Chief Complaint  Patient presents with   Dysuria    HPI Justin Choi is a 70 y.o. male.   HPI  Mr. Stimpson is known benign prostatic hypertrophy with recurring urinary tract infections.  He is here because he has dysuria and frequency that is been worsening over a 1 week period of time.  No fever or chills.  No abdominal pain.  No nausea or vomiting.  No flank pain.  No history of kidney stones or kidney problems.  He did report to his primary care doctor that he was having this difficulty.  He was referred for urology.  He sees them next month He states he is also having difficulty with sciatica.  He has pain in his right buttock that is going down his right leg.  He states this bothers him about once a year.  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 Authorizing Provider  methylPREDNISolone (MEDROL DOSEPAK) 4 MG TBPK tablet tad 12/13/20  Yes Eustace Moore, MD  amoxicillin-clavulanate (AUGMENTIN) 875-125 MG tablet Take 1 tablet by mouth every 12 (twelve) hours. 12/13/20   Eustace Moore, MD  bisoprolol (ZEBETA) 5 MG  tablet Take 5 mg by mouth daily.    [provider]  docusate sodium 100 MG CAPS Take 100 mg by mouth 2 (two) times daily. 12/06/13   Cammy Copa, MD  fluticasone (CUTIVATE) 0.05 % cream Apply topically 2 (two) times daily as needed. 11/19/20   [provider]  Multiple Vitamin (MULTIVITAMIN WITH MINERALS) TABS tablet Take 1 tablet by mouth daily.    [provider]  Omega-3 Fatty Acids (FISH OIL PO) Take 1 capsule by mouth daily.    [provider]  warfarin (COUMADIN) 5 MG tablet Take 1 tablet (5 mg total) by mouth daily. 12/06/13 04/01/20  Cammy Copa, MD    Family History Family History  Problem Relation Age of Onset   Emphysema Mother    Cancer Father     Social History Social History   Tobacco Use   Smoking status: Never   Smokeless tobacco: Never  Substance Use Topics   Alcohol use: Yes    Alcohol/week: 1.0 standard drink    Types: 1 Shots of liquor per week    Comment: once a night    Drug use: No     Allergies   Gabapentin   Review of Systems Review of Systems See HPI  Physical Exam Triage Vital Signs ED Triage Vitals  Enc  Vitals Group     BP 12/13/20 1130 (!) 166/84     Pulse Rate 12/13/20 1130 (!) 56     Resp 12/13/20 1130 18     Temp 12/13/20 1130 98.6 F (37 C)     Temp Source 12/13/20 1130 Oral     SpO2 12/13/20 1130 98 %     Weight 12/13/20 1133 256 lb (116.1 kg)     Height --      Head Circumference --      Peak Flow --      Pain Score 12/13/20 1122 4     Pain Loc --      Pain Edu? --      Excl. in GC? --    No data found.  Updated Vital Signs BP (!) 166/84 (BP Location: Left Arm)   Pulse (!) 56   Temp 98.6 F (37 C) (Oral)   Resp 18   Wt 116.1 kg   SpO2 98%   BMI 36.73 kg/m       Physical Exam Constitutional:      General: He is not in acute distress.    Appearance: Normal appearance. He is well-developed.     Comments: Pleasant.  Overweight.  No acute distress.  Using cane for  ambulation  HENT:     Head: Normocephalic and atraumatic.  Eyes:     Conjunctiva/sclera: Conjunctivae normal.     Pupils: Pupils are equal, round, and reactive to light.  Cardiovascular:     Rate and Rhythm: Normal rate.  Pulmonary:     Effort: Pulmonary effort is normal. No respiratory distress.  Abdominal:     General: There is no distension.     Palpations: Abdomen is soft.     Tenderness: There is no right CVA tenderness or left CVA tenderness.  Musculoskeletal:        General: Normal range of motion.     Cervical back: Normal range of motion.  Skin:    General: Skin is warm and dry.  Neurological:     Mental Status: He is alert.     Gait: Gait abnormal.  Psychiatric:        Behavior: Behavior normal.     UC Treatments / Results  Labs (all labs ordered are listed, but only abnormal results are displayed) Labs Reviewed  POCT URINALYSIS DIP (MANUAL ENTRY) - Abnormal; Notable for the following components:      Result Value   Clarity, UA turbid (*)    Spec Grav, UA >=1.030 (*)    Blood, UA moderate (*)    Protein Ur, POC =100 (*)    Nitrite, UA Positive (*)    Leukocytes, UA Large (3+) (*)    All other components within normal limits  URINE CULTURE    EKG   Radiology No results found.  Procedures Procedures (including critical care time)  Medications Ordered in UC Medications - No data to display  Initial Impression / Assessment and Plan / UC Course  I have reviewed the triage vital signs and the nursing notes.  Pertinent labs & imaging results that were available during my care of the patient were reviewed by me and considered in my medical decision making (see chart for details).     Patient prefers Augmentin as an antibiotic for his urinary tract infection.  His last 2 urinalysis are reviewed and the urine culture showed a pansensitive E. coli. At his request I will refill A methylprednisoloneDosepak Final Clinical Impressions(s) / UC  Diagnoses    Final diagnoses:  Acute cystitis with hematuria  Acute right-sided low back pain with right-sided sciatica     Discharge Instructions      Take the antibiotic 2 times a day for 1 week Drink lots of water Take the Medrol Dosepak as directed for your back Take all of day 1 today Follow-up with the urologist as scheduled   ED Prescriptions     Medication Sig Dispense Auth. Provider   amoxicillin-clavulanate (AUGMENTIN) 875-125 MG tablet Take 1 tablet by mouth every 12 (twelve) hours. 14 tablet Eustace Moore, MD   methylPREDNISolone (MEDROL DOSEPAK) 4 MG TBPK tablet tad 21 tablet Eustace Moore, MD      PDMP not reviewed this encounter.   Eustace Moore, MD 12/13/20 [phone]

## 2020-12-16 LAB — URINE CULTURE
MICRO NUMBER:: 12450057
SPECIMEN QUALITY:: ADEQUATE

## 2021-03-13 ENCOUNTER — Encounter: Payer: Self-pay | Admitting: Emergency Medicine

## 2021-03-13 ENCOUNTER — Emergency Department (INDEPENDENT_AMBULATORY_CARE_PROVIDER_SITE_OTHER)
Admission: EM | Admit: 2021-03-13 | Discharge: 2021-03-13 | Disposition: A | Discharge status: Home / Self Care | Payer: Medicare Other | Source: Home / Self Care

## 2021-03-13 ENCOUNTER — Other Ambulatory Visit: Payer: Self-pay

## 2021-03-13 DIAGNOSIS — J309 Allergic rhinitis, unspecified: Secondary | ICD-10-CM

## 2021-03-13 DIAGNOSIS — R509 Fever, unspecified: Secondary | ICD-10-CM | POA: Diagnosis not present

## 2021-03-13 DIAGNOSIS — R059 Cough, unspecified: Secondary | ICD-10-CM

## 2021-03-13 DIAGNOSIS — J0100 Acute maxillary sinusitis, unspecified: Secondary | ICD-10-CM

## 2021-03-13 LAB — POCT INFLUENZA A/B
Influenza A, POC: NEGATIVE
Influenza B, POC: NEGATIVE

## 2021-03-13 MED ORDER — PREDNISONE 20 MG PO TABS: ORAL_TABLET | ORAL | 0 refills | Status: AC

## 2021-03-13 MED ORDER — CEFDINIR 300 MG PO CAPS: 300.0000 mg | ORAL_CAPSULE | Freq: Two times a day (BID) | ORAL | 0 refills | Status: AC

## 2021-03-13 MED ORDER — FEXOFENADINE HCL 180 MG PO TABS: 180.0000 mg | ORAL_TABLET | Freq: Every day | ORAL | 0 refills | Status: AC

## 2021-03-13 MED ORDER — BENZONATATE 200 MG PO CAPS: 200.0000 mg | ORAL_CAPSULE | Freq: Three times a day (TID) | ORAL | 0 refills | Status: AC | PRN

## 2021-03-13 NOTE — ED Triage Notes (Signed)
Patient c/o sore throat, cough, congestion and low grade fever  x 2 days.  Patient has taken Tylenol and Ibuprofen.  Taken Immune supplements.

## 2021-03-13 NOTE — Discharge Instructions (Addendum)
Advised patient to take medication as directed with food to completion.  Advised patient to take Prednisone and Allegra with first dose of Cefdinir for the next 5 of 7 days.  May use Allegra as needed afterwards for concurrent postnasal drip/drainage.  Advised patient may use Tessalon Perles daily or as needed for cough.  Encouraged patient increase daily water intake while taking these medications.

## 2021-03-13 NOTE — ED Provider Notes (Signed)
[facility]    CSN: [id number] Arrival date & time: 03/13/21  1236      History   Chief Complaint Chief Complaint  Patient presents with   Sore Throat   Cough    HPI Justin Choi is a 70 y.o. male.   HPI 70 year old male presents with cough, sore throat, congestion and low-grade fever for 2 days.  Reports taking Tylenol and ibuprofen as needed along with immune supplements.   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 Authorizing Provider  benzonatate (TESSALON) 200 MG capsule Take 1 capsule (200 mg total) by mouth 3 (three) times daily as needed for up to 7 days for cough. 03/13/21 03/20/21 Yes Eliezer Lofts, FNP  bisoprolol (ZEBETA) 5 MG tablet Take 5 mg by mouth daily.   Yes [provider]  cefdinir (OMNICEF) 300 MG capsule Take 1 capsule (300 mg total) by mouth 2 (two) times daily for 7 days. 03/13/21 03/20/21 Yes Eliezer Lofts, FNP  docusate sodium 100 MG CAPS Take 100 mg by mouth 2 (two) times daily. 12/06/13  Yes Meredith Pel, MD  fexofenadine [facility] ALLERGY) 180 MG tablet Take 1 tablet (180 mg total) by mouth daily for 15 days. 03/13/21 03/28/21 Yes Eliezer Lofts, FNP  fluticasone (CUTIVATE) 0.05 % cream Apply topically 2 (two) times daily as needed. 11/19/20  Yes [provider]  Multiple Vitamin (MULTIVITAMIN WITH MINERALS) TABS tablet Take 1 tablet by mouth daily.   Yes [provider]  Omega-3 Fatty Acids (FISH OIL PO) Take 1 capsule by mouth daily.   Yes [provider]  predniSONE (DELTASONE) 20 MG tablet Take 3 tabs PO daily x 5 days. 03/13/21  Yes Eliezer Lofts, FNP  warfarin (COUMADIN) 5 MG tablet Take 1 tablet (5 mg total) by mouth daily. 12/06/13 04/01/20  Meredith Pel, MD    Family History Family History  Problem Relation Age of Onset   Emphysema Mother    Cancer Father     Social History Social History   Tobacco Use   Smoking status: Never   Smokeless tobacco: Never  Substance Use Topics   Alcohol use: Yes    Alcohol/week: 1.0 standard drink    Types: 1 Shots of liquor per week    Comment: once a night    Drug use: No     Allergies   Gabapentin   Review of Systems Review of Systems  Constitutional:  Positive for fever.  HENT:  Positive for congestion.   Respiratory:  Positive for cough.   All other systems reviewed and are negative.   Physical Exam Triage Vital Signs ED Triage Vitals  Enc Vitals Group     BP 03/13/21 1332 (!) 167/78  Pulse Rate 03/13/21 1332 76     Resp 03/13/21 1332 20     Temp 03/13/21 1332 99.8 F (37.7 C)     Temp Source 03/13/21 1332 Oral     SpO2 03/13/21 1332 97 %     Weight --      Height --      Head Circumference --      Peak Flow --      Pain Score 03/13/21 1333 9     Pain Loc --      Pain Edu? --      Excl. in GC? --    No data found.  Updated Vital Signs BP (!) 167/78 (BP Location: Right Arm)    Pulse 76    Temp 99.8 F (37.7 C) (Oral)    Resp 20    SpO2 97%   Physical Exam Vitals and nursing note reviewed.  Constitutional:      General: He is not in acute distress.    Appearance: Normal appearance. He is obese. He is not ill-appearing.  HENT:     Head: Normocephalic and atraumatic.     Right Ear: Tympanic membrane and external ear  normal.     Left Ear: Tympanic membrane and external ear normal.     Ears:     Comments: Significant eustachian tube dysfunction noted bilaterally    Mouth/Throat:     Mouth: Mucous membranes are moist.     Pharynx: Oropharynx is clear.     Comments: Significant amount of clear drainage of posterior oropharynx noted Eyes:     Extraocular Movements: Extraocular movements intact.     Conjunctiva/sclera: Conjunctivae normal.     Pupils: Pupils are equal, round, and reactive to light.  Cardiovascular:     Rate and Rhythm: Normal rate and regular rhythm.     Pulses: Normal pulses.     Heart sounds: Normal heart sounds.  Pulmonary:     Effort: Pulmonary effort is normal.     Breath sounds: Normal breath sounds. No wheezing, rhonchi or rales.     Comments: Infrequent nonproductive cough noted on exam Musculoskeletal:     Cervical back: Normal range of motion and neck supple.  Skin:    General: Skin is warm and dry.  Neurological:     General: No focal deficit present.     Mental Status: He is alert and oriented to person, place, and time.     UC Treatments / Results  Labs (all labs ordered are listed, but only abnormal results are displayed) Labs Reviewed  POCT INFLUENZA A/B    EKG   Radiology No results found.  Procedures Procedures (including critical care time)  Medications Ordered in UC Medications - No data to display  Initial Impression / Assessment and Plan / UC Course  I have reviewed the triage vital signs and the nursing notes.  Pertinent labs & imaging results that were available during my care of the patient were reviewed by me and considered in my medical decision making (see chart for details).     MDM: 1.  Subacute maxillary sinusitis-Rx'd Cefdinir; 2. Cough-Rx'd Prednisone and Tessalon Perles; 3.  Allergic rhinitis-Rx'd Allegra; 4.  Fever-Tylenol 1000 mg given once in clinic prior to discharge, influenza A/B were negative. Advised patient to take  medication as directed with food to completion.  Advised patient to take Prednisone and Allegra with first dose of Cefdinir for the next 5 of 7 days.  May use Allegra as needed afterwards  for concurrent postnasal drip/drainage.  Advised patient may use Tessalon Perles daily or as needed for cough.  Encouraged patient increase daily water intake while taking these medications.  Patient discharged home, hemodynamically stable. Final Clinical Impressions(s) / UC Diagnoses   Final diagnoses:  Fever, unspecified  Cough, unspecified type  Subacute maxillary sinusitis  Allergic rhinitis, unspecified seasonality, unspecified trigger     Discharge Instructions      Advised patient to take medication as directed with food to completion.  Advised patient to take Prednisone and Allegra with first dose of Cefdinir for the next 5 of 7 days.  May use Allegra as needed afterwards for concurrent postnasal drip/drainage.  Advised patient may use Tessalon Perles daily or as needed for cough.  Encouraged patient increase daily water intake while taking these medications.     ED Prescriptions     Medication Sig Dispense Auth. Provider   cefdinir (OMNICEF) 300 MG capsule Take 1 capsule (300 mg total) by mouth 2 (two) times daily for 7 days. 14 capsule Eliezer Lofts, FNP   predniSONE (DELTASONE) 20 MG tablet Take 3 tabs PO daily x 5 days. 15 tablet Eliezer Lofts, FNP   fexofenadine [facility] ALLERGY) 180 MG tablet Take 1 tablet (180 mg total) by mouth daily for 15 days. 15 tablet Eliezer Lofts, FNP   benzonatate (TESSALON) 200 MG capsule Take 1 capsule (200 mg total) by mouth 3 (three) times daily as needed for up to 7 days for cough. 40 capsule Eliezer Lofts, FNP      PDMP not reviewed this encounter.   Eliezer Lofts, [place] 03/13/21 [phone]

## 2021-05-13 DEATH — deceased
